# Patient Record
Sex: Female | Born: 1950 | Race: White | Hispanic: No | Marital: Single | Smoking: Former smoker
Health system: Southern US, Community
[De-identification: ages and names within clinical notes are randomized; demographics above are authoritative.]

## PROBLEM LIST (undated history)

## (undated) DIAGNOSIS — G4730 Sleep apnea, unspecified: Secondary | ICD-10-CM

## (undated) DIAGNOSIS — R112 Nausea with vomiting, unspecified: Secondary | ICD-10-CM

## (undated) DIAGNOSIS — I10 Essential (primary) hypertension: Secondary | ICD-10-CM

## (undated) DIAGNOSIS — F419 Anxiety disorder, unspecified: Secondary | ICD-10-CM

## (undated) DIAGNOSIS — C801 Malignant (primary) neoplasm, unspecified: Secondary | ICD-10-CM

## (undated) DIAGNOSIS — M1990 Unspecified osteoarthritis, unspecified site: Secondary | ICD-10-CM

## (undated) DIAGNOSIS — K219 Gastro-esophageal reflux disease without esophagitis: Secondary | ICD-10-CM

## (undated) DIAGNOSIS — Z9889 Other specified postprocedural states: Secondary | ICD-10-CM

## (undated) HISTORY — PX: BASAL CELL CARCINOMA EXCISION: SHX1214

## (undated) HISTORY — PX: TUBAL LIGATION: SHX77

---

## 2008-01-07 ENCOUNTER — Ambulatory Visit (HOSPITAL_COMMUNITY): Admission: RE | Admit: 2008-01-07 | Discharge: 2008-01-07 | Payer: Self-pay | Admitting: Otolaryngology

## 2010-08-01 ENCOUNTER — Encounter: Payer: Self-pay | Admitting: Otolaryngology

## 2013-07-10 ENCOUNTER — Encounter (HOSPITAL_BASED_OUTPATIENT_CLINIC_OR_DEPARTMENT_OTHER): Payer: Self-pay | Admitting: Emergency Medicine

## 2013-07-10 ENCOUNTER — Emergency Department (HOSPITAL_BASED_OUTPATIENT_CLINIC_OR_DEPARTMENT_OTHER)
Admission: EM | Admit: 2013-07-10 | Discharge: 2013-07-10 | Disposition: A | Discharge status: Home / Self Care | Payer: 59 | Attending: Emergency Medicine | Admitting: Emergency Medicine

## 2013-07-10 DIAGNOSIS — I10 Essential (primary) hypertension: Secondary | ICD-10-CM | POA: Insufficient documentation

## 2013-07-10 DIAGNOSIS — Z79899 Other long term (current) drug therapy: Secondary | ICD-10-CM | POA: Insufficient documentation

## 2013-07-10 HISTORY — DX: Essential (primary) hypertension: I10

## 2013-07-10 LAB — CBC WITH DIFFERENTIAL/PLATELET
BASOS ABS: 0 10*3/uL (ref 0.0–0.1)
BASOS PCT: 0 % (ref 0–1)
EOS ABS: 0.3 10*3/uL (ref 0.0–0.7)
Eosinophils Relative: 4 % (ref 0–5)
HCT: 41.1 % (ref 36.0–46.0)
Hemoglobin: 13.7 g/dL (ref 12.0–15.0)
Lymphocytes Relative: 27 % (ref 12–46)
Lymphs Abs: 2 10*3/uL (ref 0.7–4.0)
MCH: 29.6 pg (ref 26.0–34.0)
MCHC: 33.3 g/dL (ref 30.0–36.0)
MCV: 88.8 fL (ref 78.0–100.0)
MONOS PCT: 8 % (ref 3–12)
Monocytes Absolute: 0.6 10*3/uL (ref 0.1–1.0)
NEUTROS ABS: 4.4 10*3/uL (ref 1.7–7.7)
NEUTROS PCT: 61 % (ref 43–77)
Platelets: 232 10*3/uL (ref 150–400)
RBC: 4.63 MIL/uL (ref 3.87–5.11)
RDW: 12.7 % (ref 11.5–15.5)
WBC: 7.3 10*3/uL (ref 4.0–10.5)

## 2013-07-10 LAB — BASIC METABOLIC PANEL
BUN: 15 mg/dL (ref 6–23)
CHLORIDE: 102 meq/L (ref 96–112)
CO2: 28 mEq/L (ref 19–32)
CREATININE: 0.7 mg/dL (ref 0.50–1.10)
Calcium: 9.9 mg/dL (ref 8.4–10.5)
Glucose, Bld: 97 mg/dL (ref 70–99)
POTASSIUM: 4.3 meq/L (ref 3.7–5.3)
Sodium: 141 mEq/L (ref 137–147)

## 2013-07-10 NOTE — ED Notes (Addendum)
Pt c/o bp high and is on med for same. Pt sts she "freaked out" and it went higher. Pt sts her BP med was changed about a month ago. Pt also c/o of "feeling a little lightheaded" and has appt with PCP next Tuesday.

## 2013-07-10 NOTE — Discharge Instructions (Signed)
Keep a record of your blood pressures at home to take with you to your next doctor's appointment.  Return to the emergency department if you develop severe headache, chest pain, dizziness, or other new or concerning symptoms   Arterial Hypertension Arterial hypertension (high blood pressure) is a condition of elevated pressure in your blood vessels. Hypertension over a long period of time is a risk factor for strokes, heart attacks, and heart failure. It is also the leading cause of kidney (renal) failure.  CAUSES   In Adults -- Over 90% of all hypertension has no known cause. This is called essential or primary hypertension. In the other 10% of people with hypertension, the increase in blood pressure is caused by another disorder. This is called secondary hypertension. Important causes of secondary hypertension are:  Heavy alcohol use.  Obstructive sleep apnea.  Hyperaldosterosim (Conn's syndrome).  Steroid use.  Chronic kidney failure.  Hyperparathyroidism.  Medications.  Renal artery stenosis.  Pheochromocytoma.  Cushing's disease.  Coarctation of the aorta.  Scleroderma renal crisis.  Licorice (in excessive amounts).  Drugs (cocaine, methamphetamine). Your caregiver can explain any items above that apply to you.  In Children -- Secondary hypertension is more common and should always be considered.  Pregnancy -- Few women of childbearing age have high blood pressure. However, up to 10% of them develop hypertension of pregnancy. Generally, this will not harm the woman. It may be a sign of 3 complications of pregnancy: preeclampsia, HELLP syndrome, and eclampsia. Follow up and control with medication is necessary. SYMPTOMS   This condition normally does not produce any noticeable symptoms. It is usually found during a routine exam.  Malignant hypertension is a late problem of high blood pressure. It may have the following symptoms:  Headaches.  Blurred  vision.  End-organ damage (this means your kidneys, heart, lungs, and other organs are being damaged).  Stressful situations can increase the blood pressure. If a person with normal blood pressure has their blood pressure go up while being seen by their caregiver, this is often termed "white coat hypertension." Its importance is not known. It may be related with eventually developing hypertension or complications of hypertension.  Hypertension is often confused with mental tension, stress, and anxiety. DIAGNOSIS  The diagnosis is made by 3 separate blood pressure measurements. They are taken at least 1 week apart from each other. If there is organ damage from hypertension, the diagnosis may be made without repeat measurements. Hypertension is usually identified by having blood pressure readings:  Above 140/90 mmHg measured in both arms, at 3 separate times, over a couple weeks.  Over 130/80 mmHg should be considered a risk factor and may require treatment in patients with diabetes. Blood pressure readings over 120/80 mmHg are called "pre-hypertension" even in non-diabetic patients. To get a true blood pressure measurement, use the following guidelines. Be aware of the factors that can alter blood pressure readings.  Take measurements at least 1 hour after caffeine.  Take measurements 30 minutes after smoking and without any stress. This is another reason to quit smoking  it raises your blood pressure.  Use a proper cuff size. Ask your caregiver if you are not sure about your cuff size.  Most home blood pressure cuffs are automatic. They will measure systolic and diastolic pressures. The systolic pressure is the pressure reading at the start of sounds. Diastolic pressure is the pressure at which the sounds disappear. If you are elderly, measure pressures in multiple postures. Try sitting, lying  or standing.  Sit at rest for a minimum of 5 minutes before taking measurements.  You should not  be on any medications like decongestants. These are found in many cold medications.  Record your blood pressure readings and review them with your caregiver. If you have hypertension:  Your caregiver may do tests to be sure you do not have secondary hypertension (see "causes" above).  Your caregiver may also look for signs of metabolic syndrome. This is also called Syndrome X or Insulin Resistance Syndrome. You may have this syndrome if you have type 2 diabetes, abdominal obesity, and abnormal blood lipids in addition to hypertension.  Your caregiver will take your medical and family history and perform a physical exam.  Diagnostic tests may include blood tests (for glucose, cholesterol, potassium, and kidney function), a urinalysis, or an EKG. Other tests may also be necessary depending on your condition. PREVENTION  There are important lifestyle issues that you can adopt to reduce your chance of developing hypertension:  Maintain a normal weight.  Limit the amount of salt (sodium) in your diet.  Exercise often.  Limit alcohol intake.  Get enough potassium in your diet. Discuss specific advice with your caregiver.  Follow a DASH diet (dietary approaches to stop hypertension). This diet is rich in fruits, vegetables, and low-fat dairy products, and avoids certain fats. PROGNOSIS  Essential hypertension cannot be cured. Lifestyle changes and medical treatment can lower blood pressure and reduce complications. The prognosis of secondary hypertension depends on the underlying cause. Many people whose hypertension is controlled with medicine or lifestyle changes can live a normal, healthy life.  RISKS AND COMPLICATIONS  While high blood pressure alone is not an illness, it often requires treatment due to its short- and long-term effects on many organs. Hypertension increases your risk for:  CVAs or strokes (cerebrovascular accident).  Heart failure due to chronically high blood pressure  (hypertensive cardiomyopathy).  Heart attack (myocardial infarction).  Damage to the retina (hypertensive retinopathy).  Kidney failure (hypertensive nephropathy). Your caregiver can explain list items above that apply to you. Treatment of hypertension can significantly reduce the risk of complications. TREATMENT   For overweight patients, weight loss and regular exercise are recommended. Physical fitness lowers blood pressure.  Mild hypertension is usually treated with diet and exercise. A diet rich in fruits and vegetables, fat-free dairy products, and foods low in fat and salt (sodium) can help lower blood pressure. Decreasing salt intake decreases blood pressure in a 1/3 of people.  Stop smoking if you are a smoker. The steps above are highly effective in reducing blood pressure. While these actions are easy to suggest, they are difficult to achieve. Most patients with moderate or severe hypertension end up requiring medications to bring their blood pressure down to a normal level. There are several classes of medications for treatment. Blood pressure pills (antihypertensives) will lower blood pressure by their different actions. Lowering the blood pressure by 10 mmHg may decrease the risk of complications by as much as 25%. The goal of treatment is effective blood pressure control. This will reduce your risk for complications. Your caregiver will help you determine the best treatment for you according to your lifestyle. What is excellent treatment for one person, may not be for you. HOME CARE INSTRUCTIONS   Do not smoke.  Follow the lifestyle changes outlined in the "Prevention" section.  If you are on medications, follow the directions carefully. Blood pressure medications must be taken as prescribed. Skipping doses reduces their  benefit. It also puts you at risk for problems.  Follow up with your caregiver, as directed.  If you are asked to monitor your blood pressure at home,  follow the guidelines in the "Diagnosis" section above. SEEK MEDICAL CARE IF:   You think you are having medication side effects.  You have recurrent headaches or lightheadedness.  You have swelling in your ankles.  You have trouble with your vision. SEEK IMMEDIATE MEDICAL CARE IF:   You have sudden onset of chest pain or pressure, difficulty breathing, or other symptoms of a heart attack.  You have a severe headache.  You have symptoms of a stroke (such as sudden weakness, difficulty speaking, difficulty walking). MAKE SURE YOU:   Understand these instructions.  Will watch your condition.  Will get help right away if you are not doing well or get worse. Document Released: 06/26/2005 Document Revised: 09/18/2011 Document Reviewed: 01/24/2007 [facility] Patient Information 2014 [place].

## 2013-07-10 NOTE — ED Provider Notes (Signed)
CSN: [id number]     Arrival date & time 07/10/13  1218 History   First MD Initiated Contact with Patient 07/10/13 1327     Chief Complaint  Patient presents with  . Hypertension   (Consider location/radiation/quality/duration/timing/severity/associated sxs/prior Treatment) HPI Comments: Patient is a 63 year old female with history of hypertension on lisinopril. She presents today with complaints of elevated blood pressure. She was at home with her mother when she took her blood pressure with her mother's machine. He gave resolve approximately 180/100. She became anxious about this and check it again and it was higher. She then presented here to be evaluated. She denies she is experiencing any headache, chest pain, shortness of breath, dizziness or any other symptom.  Patient is a 63 y.o. female presenting with hypertension. The history is provided by the patient.  Hypertension This is a new problem. Episode onset: This morning. The problem occurs constantly. The problem has been gradually improving. Pertinent negatives include no chest pain, no abdominal pain and no shortness of breath. Nothing aggravates the symptoms. Nothing relieves the symptoms. She has tried nothing for the symptoms. The treatment provided no relief.    Past Medical History  Diagnosis Date  . Hypertension    History reviewed. No pertinent past surgical history. No family history on file. History  Substance Use Topics  . Smoking status: Never Smoker   . Smokeless tobacco: Not on file  . Alcohol Use: 0.6 oz/week    1 Cans of beer per week   OB History   Grav Para Term Preterm Abortions TAB SAB Ect Mult Living                 Review of Systems  Respiratory: Negative for shortness of breath.   Cardiovascular: Negative for chest pain.  Gastrointestinal: Negative for abdominal pain.  All other systems reviewed and are negative.    Allergies  Review of patient's allergies indicates no known allergies.  Home  Medications   Current Outpatient Rx  Name  Route  Sig  Dispense  Refill  . ibuprofen (ADVIL,MOTRIN) 800 MG tablet   Oral   Take 800 mg by mouth every 8 (eight) hours as needed.         Marland Kitchen lisinopril (PRINIVIL,ZESTRIL) 20 MG tablet   Oral   Take 20 mg by mouth daily.          BP 145/85  Pulse 94  Temp(Src) 98.4 F (36.9 C) (Oral)  Resp 18  Ht 6' (1.829 m)  Wt 204 lb (92.534 kg)  BMI 27.66 kg/m2  SpO2 100% Physical Exam  Nursing note and vitals reviewed. Constitutional: She is oriented to person, place, and time. She appears well-developed and well-nourished. No distress.  HENT:  Head: Normocephalic and atraumatic.  Eyes: EOM are normal. Pupils are equal, round, and reactive to light.  Neck: Normal range of motion. Neck supple.  Cardiovascular: Normal rate and regular rhythm.  Exam reveals no gallop and no friction rub.   No murmur heard. Pulmonary/Chest: Effort normal and breath sounds normal. No respiratory distress. She has no wheezes.  Abdominal: Soft. Bowel sounds are normal. She exhibits no distension. There is no tenderness.  Musculoskeletal: Normal range of motion.  Neurological: She is alert and oriented to person, place, and time. No cranial nerve deficit. She exhibits normal muscle tone. Coordination normal.  Skin: Skin is warm and dry. She is not diaphoretic.    ED Course  Procedures (including critical care time) Labs Review Labs Reviewed  CBC WITH DIFFERENTIAL  BASIC METABOLIC PANEL   Imaging Review No results found.  EKG Interpretation    Date/Time:  Thursday July 10 2013 12:52:45 EST Ventricular Rate:  86 PR Interval:  138 QRS Duration: 94 QT Interval:  360 QTC Calculation: 430 R Axis:   44 Text Interpretation:  Normal sinus rhythm Normal ECG Confirmed by DELOS  MD, Giovanni Bath ([id number]) on 07/10/2013 1:35:31 PM            MDM  No diagnosis found. Patient is a 63 year old female who presents here for evaluation of hypertension. Her blood  pressures were elevated on the home monitor and were elevated initially upon presentation. While I was evaluating the patient her blood pressure was 142/85. Laboratory studies are unremarkable and EKG is normal at this point I feel as though she is stable for discharge. She is to monitor her blood pressures at home keep a record of these to present to her primary doctor her next appointment to discuss whether she needs adjustments in her medications.    Veryl Speak, MD 07/10/13 1452

## 2013-09-11 NOTE — H&P (Signed)
TOTAL HIP ADMISSION H&P  Patient is admitted for left total hip arthroplasty, anterior approach.  Subjective:  Chief Complaint:      Left hip OA / pain  HPI: Mary Garner, 63 y.o. female, has a history of pain and functional disability in the left hip(s) due to arthritis and patient has failed non-surgical conservative treatments for greater than 12 weeks to include NSAID's and/or analgesics, corticosteriod injections and activity modification.  Onset of symptoms was gradual starting 1+ years ago with gradually worsening course since that time.The patient noted no past surgery on the left hip(s).  Patient currently rates pain in the left hip at 10 out of 10 with activity. Patient has night pain, worsening of pain with activity and weight bearing, trendelenberg gait, pain that interfers with activities of daily living and pain with passive range of motion. Patient has evidence of periarticular osteophytes and joint space narrowing by imaging studies. This condition presents safety issues increasing the risk of falls.  There is no current active infection.  Risks, benefits and expectations were discussed with the patient.  Risks including but not limited to the risk of anesthesia, blood clots, nerve damage, blood vessel damage, failure of the prosthesis, infection and up to and including death.  Patient understand the risks, benefits and expectations and wishes to proceed with surgery.   D/C Plans:   Home with HHPT  Post-op Meds:    No Rx given  Tranexamic Acid:   To be given  Decadron:    To be given  FYI:    ASA post-op  Norco post-op    Past Medical History  Diagnosis Date  . Hypertension   . Anxiety   . Sleep apnea     not diagnosed, sleep study in April, occasionally stops breathing  . GERD (gastroesophageal reflux disease)   . Arthritis   . Cancer     basal cell on face     Past Surgical History  Procedure Laterality Date  . Basal cell carcinoma excision  10 or 12 years ago    x3   . Tubal ligation  30 years ago    No Known Allergies   History  Substance Use Topics  . Smoking status: Never Smoker   . Smokeless tobacco: Never Used  . Alcohol Use: 0.0 oz/week     Comment: 2 beers or 2 wine glasses daily        Review of Systems  Constitutional: Negative.   HENT: Positive for hearing loss and tinnitus.   Eyes: Negative.   Respiratory: Negative.   Cardiovascular: Negative.   Gastrointestinal: Positive for heartburn.  Genitourinary: Negative.   Musculoskeletal: Positive for back pain and joint pain.  Skin: Negative.   Neurological: Negative.   Endo/Heme/Allergies: Negative.   Psychiatric/Behavioral: The patient is nervous/anxious.     Objective:  Physical Exam  Constitutional: She is oriented to person, place, and time. She appears well-developed and well-nourished.  HENT:  Head: Normocephalic and atraumatic.  Mouth/Throat: Oropharynx is clear and moist.  Eyes: Pupils are equal, round, and reactive to light.  Neck: Neck supple. No JVD present. No tracheal deviation present. No thyromegaly present.  Cardiovascular: Normal rate, regular rhythm, normal heart sounds and intact distal pulses.   Respiratory: Effort normal and breath sounds normal. No stridor. No respiratory distress. She has no wheezes.  GI: Soft. There is no tenderness. There is no guarding.  Musculoskeletal:       Left hip: She exhibits decreased range of motion, decreased  strength, tenderness and bony tenderness. She exhibits no swelling, no deformity and no laceration.  Lymphadenopathy:    She has no cervical adenopathy.  Neurological: She is alert and oriented to person, place, and time.  Skin: Skin is warm and dry.  Psychiatric: She has a normal mood and affect.     Labs:   Estimated body mass index is 27.66 kg/(m^2) as calculated from the following:   Height as of 07/10/13: 6' (1.829 m).   Weight as of 07/10/13: 92.534 kg (204 lb).   Imaging Review Plain radiographs  demonstrate severe degenerative joint disease of the left hip(s). The bone quality appears to be good for age and reported activity level.  Assessment/Plan:  End stage arthritis, left hip(s)  The patient history, physical examination, clinical judgement of the provider and imaging studies are consistent with end stage degenerative joint disease of the left hip(s) and total hip arthroplasty is deemed medically necessary. The treatment options including medical management, injection therapy, arthroscopy and arthroplasty were discussed at length. The risks and benefits of total hip arthroplasty were presented and reviewed. The risks due to aseptic loosening, infection, stiffness, dislocation/subluxation,  thromboembolic complications and other imponderables were discussed.  The patient acknowledged the explanation, agreed to proceed with the plan and consent was signed. Patient is being admitted for inpatient treatment for surgery, pain control, PT, OT, prophylactic antibiotics, VTE prophylaxis, progressive ambulation and ADL's and discharge planning.The patient is planning to be discharged home with home health services.      West Pugh [name]   PAC  09/11/2013, 10:21 AM

## 2013-09-12 ENCOUNTER — Encounter (HOSPITAL_COMMUNITY): Payer: Self-pay | Admitting: Pharmacy Technician

## 2013-09-16 ENCOUNTER — Encounter (HOSPITAL_COMMUNITY)
Admission: RE | Admit: 2013-09-16 | Discharge: 2013-09-16 | Disposition: A | Discharge status: Home / Self Care | Payer: 59 | Source: Ambulatory Visit | Attending: Orthopedic Surgery | Admitting: Orthopedic Surgery

## 2013-09-16 ENCOUNTER — Ambulatory Visit (HOSPITAL_COMMUNITY)
Admission: RE | Admit: 2013-09-16 | Discharge: 2013-09-16 | Disposition: A | Discharge status: Home / Self Care | Payer: 59 | Source: Ambulatory Visit | Attending: Orthopedic Surgery | Admitting: Orthopedic Surgery

## 2013-09-16 ENCOUNTER — Encounter (HOSPITAL_COMMUNITY): Payer: Self-pay

## 2013-09-16 DIAGNOSIS — Z01812 Encounter for preprocedural laboratory examination: Secondary | ICD-10-CM | POA: Insufficient documentation

## 2013-09-16 DIAGNOSIS — Z01818 Encounter for other preprocedural examination: Secondary | ICD-10-CM | POA: Insufficient documentation

## 2013-09-16 HISTORY — DX: Anxiety disorder, unspecified: F41.9

## 2013-09-16 HISTORY — DX: Unspecified osteoarthritis, unspecified site: M19.90

## 2013-09-16 HISTORY — DX: Malignant (primary) neoplasm, unspecified: C80.1

## 2013-09-16 HISTORY — DX: Sleep apnea, unspecified: G47.30

## 2013-09-16 HISTORY — DX: Gastro-esophageal reflux disease without esophagitis: K21.9

## 2013-09-16 LAB — URINALYSIS, ROUTINE W REFLEX MICROSCOPIC
Bilirubin Urine: NEGATIVE
GLUCOSE, UA: NEGATIVE mg/dL
Hgb urine dipstick: NEGATIVE
Ketones, ur: NEGATIVE mg/dL
NITRITE: NEGATIVE
PROTEIN: NEGATIVE mg/dL
Specific Gravity, Urine: 1.017 (ref 1.005–1.030)
Urobilinogen, UA: 0.2 mg/dL (ref 0.0–1.0)
pH: 5 (ref 5.0–8.0)

## 2013-09-16 LAB — CBC
HEMATOCRIT: 40.4 % (ref 36.0–46.0)
Hemoglobin: 13.7 g/dL (ref 12.0–15.0)
MCH: 30.1 pg (ref 26.0–34.0)
MCHC: 33.9 g/dL (ref 30.0–36.0)
MCV: 88.8 fL (ref 78.0–100.0)
Platelets: 267 10*3/uL (ref 150–400)
RBC: 4.55 MIL/uL (ref 3.87–5.11)
RDW: 13.4 % (ref 11.5–15.5)
WBC: 7.1 10*3/uL (ref 4.0–10.5)

## 2013-09-16 LAB — BASIC METABOLIC PANEL
BUN: 15 mg/dL (ref 6–23)
CO2: 26 meq/L (ref 19–32)
Calcium: 9.6 mg/dL (ref 8.4–10.5)
Chloride: 103 mEq/L (ref 96–112)
Creatinine, Ser: 0.65 mg/dL (ref 0.50–1.10)
GFR calc Af Amer: >90 mL/min (ref >90)
GFR calc non Af Amer: >90 mL/min (ref >90)
Glucose, Bld: 94 mg/dL (ref 70–99)
POTASSIUM: 4.1 meq/L (ref 3.7–5.3)
Sodium: 140 mEq/L (ref 137–147)

## 2013-09-16 LAB — URINE MICROSCOPIC-ADD ON

## 2013-09-16 LAB — SURGICAL PCR SCREEN
MRSA, PCR: NEGATIVE
Staphylococcus aureus: NEGATIVE

## 2013-09-16 LAB — PROTIME-INR
INR: 0.87 (ref 0.00–1.49)
Prothrombin Time: 11.7 seconds (ref 11.6–15.2)

## 2013-09-16 LAB — ABO/RH: ABO/RH(D): A POS

## 2013-09-16 LAB — APTT: APTT: 26 s (ref 24–37)

## 2013-09-16 NOTE — Progress Notes (Signed)
Surgery clearance note Dr. Noberto Retort on chart, [place] note Dr. Noberto Retort 08/18/13 on chart, EKG 07/11/13 on EPIC

## 2013-09-16 NOTE — Progress Notes (Signed)
09/16/13 1450  OBSTRUCTIVE SLEEP APNEA  Have you ever been diagnosed with sleep apnea through a sleep study? No  Do you snore loudly (loud enough to be heard through closed doors)?  1  Do you often feel tired, fatigued, or sleepy during the daytime? 1  Has anyone observed you stop breathing during your sleep? 1  Do you have, or are you being treated for high blood pressure? 1  BMI more than 35 kg/m2? 0  Age over 62 years old? 1  Neck circumference greater than 40 cm/18 inches? 0  Gender: 0  Obstructive Sleep Apnea Score 5  Score 4 or greater  Results sent to PCP

## 2013-09-16 NOTE — Patient Instructions (Addendum)
[place]  09/16/2013   Your procedure is scheduled on: 09/23/13  Report to [facility] at 12:00 PM.  Call this number if you have problems the morning of surgery [phone]: [phone]   Remember:   Do not eat food After Midnight, clear liquids from midnight until 9:00 am on 09/23/13 then nothing.      Take these medicines the morning of surgery with A SIP OF WATER: amlodipine   Do not wear jewelry, make-up or nail polish.  Do not wear lotions, powders, or perfumes. You may wear deodorant.  Do not shave 48 hours prior to surgery. Men may shave face and neck.  Do not bring valuables to the hospital.  Contacts, dentures or bridgework may not be worn into surgery.  Leave suitcase in the car. After surgery it may be brought to your room.  For patients admitted to the hospital, checkout time is 11:00 AM the day of discharge.   Please read over the following fact sheets that you were given:[facility] preparing for surgery sheet, MRSA Information, incentive spirometry fact sheet, blood fact sheet Paulette Blanch, RN  pre op nurse call if needed [phone]    FAILURE TO [facility]   Patient Signature: ___________________________________________

## 2013-09-22 NOTE — Progress Notes (Addendum)
Surgery time changed from 1500 to 0900 on 09/23/13. Pt called and agreed to arrive at short stay at 6:30 am on 09/23/13. No questions or concerns.

## 2013-09-23 ENCOUNTER — Encounter (HOSPITAL_COMMUNITY): Payer: 59 | Admitting: Anesthesiology

## 2013-09-23 ENCOUNTER — Inpatient Hospital Stay (HOSPITAL_COMMUNITY): Payer: 59

## 2013-09-23 ENCOUNTER — Encounter (HOSPITAL_COMMUNITY): Payer: Self-pay

## 2013-09-23 ENCOUNTER — Encounter (HOSPITAL_COMMUNITY)
Admission: RE | Disposition: A | Discharge status: Home Health | Payer: Self-pay | Source: Ambulatory Visit | Attending: Orthopedic Surgery

## 2013-09-23 ENCOUNTER — Inpatient Hospital Stay (HOSPITAL_COMMUNITY): Payer: 59 | Admitting: Anesthesiology

## 2013-09-23 ENCOUNTER — Inpatient Hospital Stay (HOSPITAL_COMMUNITY)
Admission: RE | Admit: 2013-09-23 | Discharge: 2013-09-24 | DRG: 470 | Disposition: A | Discharge status: Home Health | Payer: 59 | Source: Ambulatory Visit | Attending: Orthopedic Surgery | Admitting: Orthopedic Surgery

## 2013-09-23 DIAGNOSIS — D500 Iron deficiency anemia secondary to blood loss (chronic): Secondary | ICD-10-CM | POA: Diagnosis not present

## 2013-09-23 DIAGNOSIS — E669 Obesity, unspecified: Secondary | ICD-10-CM | POA: Diagnosis present

## 2013-09-23 DIAGNOSIS — M169 Osteoarthritis of hip, unspecified: Principal | ICD-10-CM | POA: Diagnosis present

## 2013-09-23 DIAGNOSIS — M1610 Unilateral primary osteoarthritis, unspecified hip: Principal | ICD-10-CM | POA: Diagnosis present

## 2013-09-23 DIAGNOSIS — Z96649 Presence of unspecified artificial hip joint: Secondary | ICD-10-CM

## 2013-09-23 DIAGNOSIS — Z85828 Personal history of other malignant neoplasm of skin: Secondary | ICD-10-CM

## 2013-09-23 DIAGNOSIS — I10 Essential (primary) hypertension: Secondary | ICD-10-CM | POA: Diagnosis present

## 2013-09-23 DIAGNOSIS — F411 Generalized anxiety disorder: Secondary | ICD-10-CM | POA: Diagnosis present

## 2013-09-23 DIAGNOSIS — Z6831 Body mass index (BMI) 31.0-31.9, adult: Secondary | ICD-10-CM

## 2013-09-23 DIAGNOSIS — K219 Gastro-esophageal reflux disease without esophagitis: Secondary | ICD-10-CM | POA: Diagnosis present

## 2013-09-23 DIAGNOSIS — D62 Acute posthemorrhagic anemia: Secondary | ICD-10-CM | POA: Diagnosis not present

## 2013-09-23 HISTORY — PX: TOTAL HIP ARTHROPLASTY: SHX124

## 2013-09-23 LAB — TYPE AND SCREEN
ABO/RH(D): A POS
Antibody Screen: NEGATIVE

## 2013-09-23 SURGERY — ARTHROPLASTY, HIP, TOTAL, ANTERIOR APPROACH
Anesthesia: Spinal | Site: Hip | Laterality: Left

## 2013-09-23 MED ORDER — 0.9 % SODIUM CHLORIDE (POUR BTL) OPTIME
TOPICAL | Status: DC | PRN
  Administered 2013-09-23: 1000 mL

## 2013-09-23 MED ORDER — SENNA 8.6 MG PO TABS
1.0000 | ORAL_TABLET | Freq: Two times a day (BID) | ORAL | Status: DC
  Administered 2013-09-23 (×2): 8.6 mg via ORAL

## 2013-09-23 MED ORDER — LACTATED RINGERS IV SOLN: INTRAVENOUS | Status: DC | PRN

## 2013-09-23 MED ORDER — DEXAMETHASONE SODIUM PHOSPHATE 10 MG/ML IJ SOLN
10.0000 mg | Freq: Once | INTRAMUSCULAR | Status: AC
  Administered 2013-09-23: 10 mg via INTRAVENOUS

## 2013-09-23 MED ORDER — HYDROMORPHONE HCL PF 1 MG/ML IJ SOLN: 0.5000 mg | INTRAMUSCULAR | Status: DC | PRN

## 2013-09-23 MED ORDER — LACTATED RINGERS IV SOLN: INTRAVENOUS | Status: DC

## 2013-09-23 MED ORDER — BUPIVACAINE HCL (PF) 0.5 % IJ SOLN
INTRAMUSCULAR | Status: AC
  Filled 2013-09-23: qty 30

## 2013-09-23 MED ORDER — LIDOCAINE HCL (CARDIAC) 20 MG/ML IV SOLN
INTRAVENOUS | Status: AC
  Filled 2013-09-23: qty 5

## 2013-09-23 MED ORDER — ALUM & MAG HYDROXIDE-SIMETH 200-200-20 MG/5ML PO SUSP: 30.0000 mL | ORAL | Status: DC | PRN

## 2013-09-23 MED ORDER — MIDAZOLAM HCL 2 MG/2ML IJ SOLN
INTRAMUSCULAR | Status: AC
  Filled 2013-09-23: qty 2

## 2013-09-23 MED ORDER — HYDROCODONE-ACETAMINOPHEN 7.5-325 MG PO TABS
1.0000 | ORAL_TABLET | ORAL | Status: DC
  Administered 2013-09-23: 1 via ORAL
  Administered 2013-09-23: 2 via ORAL
  Administered 2013-09-23: 1 via ORAL
  Administered 2013-09-24 (×2): 2 via ORAL
  Filled 2013-09-23 (×3): qty 2
  Filled 2013-09-23: qty 1
  Filled 2013-09-23: qty 2
  Filled 2013-09-23: qty 1

## 2013-09-23 MED ORDER — PROPOFOL 10 MG/ML IV BOLUS
INTRAVENOUS | Status: AC
  Filled 2013-09-23: qty 20

## 2013-09-23 MED ORDER — FAMOTIDINE 20 MG PO TABS
20.0000 mg | ORAL_TABLET | Freq: Every day | ORAL | Status: DC | PRN
  Filled 2013-09-23: qty 1

## 2013-09-23 MED ORDER — AMLODIPINE BESYLATE 5 MG PO TABS
5.0000 mg | ORAL_TABLET | Freq: Every morning | ORAL | Status: DC
  Administered 2013-09-24: 5 mg via ORAL
  Filled 2013-09-23: qty 1

## 2013-09-23 MED ORDER — METOCLOPRAMIDE HCL 10 MG PO TABS: 5.0000 mg | ORAL_TABLET | Freq: Four times a day (QID) | ORAL | Status: DC | PRN

## 2013-09-23 MED ORDER — FERROUS SULFATE 325 (65 FE) MG PO TABS
325.0000 mg | ORAL_TABLET | Freq: Three times a day (TID) | ORAL | Status: DC
Start: 2013-09-23 — End: 2013-09-24
  Administered 2013-09-24: 325 mg via ORAL
  Filled 2013-09-23 (×5): qty 1

## 2013-09-23 MED ORDER — FENTANYL CITRATE 0.05 MG/ML IJ SOLN
INTRAMUSCULAR | Status: AC
  Filled 2013-09-23: qty 2

## 2013-09-23 MED ORDER — SODIUM CHLORIDE 0.9 % IV SOLN
INTRAVENOUS | Status: DC
  Administered 2013-09-23 (×2): via INTRAVENOUS
  Filled 2013-09-23 (×5): qty 1000

## 2013-09-23 MED ORDER — FENTANYL CITRATE 0.05 MG/ML IJ SOLN
INTRAMUSCULAR | Status: DC | PRN
  Administered 2013-09-23: 100 ug via INTRAVENOUS

## 2013-09-23 MED ORDER — METHOCARBAMOL 500 MG PO TABS
500.0000 mg | ORAL_TABLET | Freq: Four times a day (QID) | ORAL | Status: DC | PRN
  Administered 2013-09-24: 500 mg via ORAL
  Filled 2013-09-23: qty 1

## 2013-09-23 MED ORDER — CEFAZOLIN SODIUM-DEXTROSE 2-3 GM-% IV SOLR
INTRAVENOUS | Status: AC
  Filled 2013-09-23: qty 50

## 2013-09-23 MED ORDER — MIDAZOLAM HCL 5 MG/5ML IJ SOLN
INTRAMUSCULAR | Status: DC | PRN
  Administered 2013-09-23: 2 mg via INTRAVENOUS

## 2013-09-23 MED ORDER — CEFAZOLIN SODIUM-DEXTROSE 2-3 GM-% IV SOLR
2.0000 g | INTRAVENOUS | Status: AC
  Administered 2013-09-23: 2 g via INTRAVENOUS

## 2013-09-23 MED ORDER — DEXAMETHASONE SODIUM PHOSPHATE 10 MG/ML IJ SOLN
10.0000 mg | Freq: Once | INTRAMUSCULAR | Status: DC
  Filled 2013-09-23: qty 1

## 2013-09-23 MED ORDER — MENTHOL 3 MG MT LOZG: 1.0000 | LOZENGE | OROMUCOSAL | Status: DC | PRN

## 2013-09-23 MED ORDER — ASPIRIN EC 325 MG PO TBEC
325.0000 mg | DELAYED_RELEASE_TABLET | Freq: Two times a day (BID) | ORAL | Status: DC
  Administered 2013-09-24: 325 mg via ORAL
  Filled 2013-09-23 (×3): qty 1

## 2013-09-23 MED ORDER — ALPRAZOLAM 0.5 MG PO TABS
0.5000 mg | ORAL_TABLET | Freq: Two times a day (BID) | ORAL | Status: DC | PRN
  Administered 2013-09-23: 0.5 mg via ORAL
  Filled 2013-09-23: qty 1

## 2013-09-23 MED ORDER — SODIUM CHLORIDE 0.9 % IV SOLN
1000.0000 mg | Freq: Once | INTRAVENOUS | Status: AC
  Administered 2013-09-23: 1000 mg via INTRAVENOUS
  Filled 2013-09-23: qty 10

## 2013-09-23 MED ORDER — HYDROMORPHONE HCL PF 1 MG/ML IJ SOLN: 0.2500 mg | INTRAMUSCULAR | Status: DC | PRN

## 2013-09-23 MED ORDER — DEXAMETHASONE SODIUM PHOSPHATE 10 MG/ML IJ SOLN
10.0000 mg | Freq: Once | INTRAMUSCULAR | Status: AC
  Administered 2013-09-24: 10 mg via INTRAVENOUS
  Filled 2013-09-23: qty 1

## 2013-09-23 MED ORDER — LACTATED RINGERS IV SOLN
INTRAVENOUS | Status: DC | PRN
  Administered 2013-09-23 (×2): via INTRAVENOUS

## 2013-09-23 MED ORDER — KETAMINE HCL 10 MG/ML IJ SOLN
INTRAMUSCULAR | Status: AC
  Filled 2013-09-23: qty 1

## 2013-09-23 MED ORDER — DIPHENHYDRAMINE HCL 12.5 MG/5ML PO ELIX: 25.0000 mg | ORAL_SOLUTION | Freq: Four times a day (QID) | ORAL | Status: DC | PRN

## 2013-09-23 MED ORDER — POLYETHYLENE GLYCOL 3350 17 G PO PACK: 17.0000 g | PACK | Freq: Every day | ORAL | Status: DC | PRN

## 2013-09-23 MED ORDER — PHENOL 1.4 % MT LIQD: 1.0000 | OROMUCOSAL | Status: DC | PRN

## 2013-09-23 MED ORDER — PROPOFOL INFUSION 10 MG/ML OPTIME
INTRAVENOUS | Status: DC | PRN
  Administered 2013-09-23: 70 ug/kg/min via INTRAVENOUS

## 2013-09-23 MED ORDER — ONDANSETRON HCL 4 MG/2ML IJ SOLN
4.0000 mg | Freq: Four times a day (QID) | INTRAMUSCULAR | Status: DC | PRN
  Administered 2013-09-24: 4 mg via INTRAVENOUS
  Filled 2013-09-23: qty 2

## 2013-09-23 MED ORDER — ROCURONIUM BROMIDE 100 MG/10ML IV SOLN
INTRAVENOUS | Status: AC
  Filled 2013-09-23: qty 1

## 2013-09-23 MED ORDER — ASPIRIN EC 325 MG PO TBEC
325.0000 mg | DELAYED_RELEASE_TABLET | Freq: Two times a day (BID) | ORAL | Status: DC
  Filled 2013-09-23 (×2): qty 1

## 2013-09-23 MED ORDER — BUPIVACAINE HCL (PF) 0.5 % IJ SOLN
INTRAMUSCULAR | Status: DC | PRN
  Administered 2013-09-23: 15 mg

## 2013-09-23 MED ORDER — METHOCARBAMOL 100 MG/ML IJ SOLN
500.0000 mg | Freq: Four times a day (QID) | INTRAVENOUS | Status: DC | PRN
  Administered 2013-09-23: 500 mg via INTRAVENOUS
  Filled 2013-09-23: qty 5

## 2013-09-23 MED ORDER — METOCLOPRAMIDE HCL 5 MG/ML IJ SOLN: 5.0000 mg | Freq: Four times a day (QID) | INTRAMUSCULAR | Status: DC | PRN

## 2013-09-23 MED ORDER — PHENYLEPHRINE 40 MCG/ML (10ML) SYRINGE FOR IV PUSH (FOR BLOOD PRESSURE SUPPORT)
PREFILLED_SYRINGE | INTRAVENOUS | Status: AC
  Filled 2013-09-23: qty 10

## 2013-09-23 MED ORDER — CEFAZOLIN SODIUM-DEXTROSE 2-3 GM-% IV SOLR
2.0000 g | Freq: Four times a day (QID) | INTRAVENOUS | Status: AC
  Administered 2013-09-23 (×2): 2 g via INTRAVENOUS
  Filled 2013-09-23 (×2): qty 50

## 2013-09-23 MED ORDER — PHENYLEPHRINE HCL 10 MG/ML IJ SOLN
INTRAMUSCULAR | Status: DC | PRN
  Administered 2013-09-23: 80 ug via INTRAVENOUS

## 2013-09-23 MED ORDER — ONDANSETRON HCL 4 MG/2ML IJ SOLN
INTRAMUSCULAR | Status: DC | PRN
  Administered 2013-09-23: 4 mg via INTRAVENOUS

## 2013-09-23 MED ORDER — KETAMINE HCL 10 MG/ML IJ SOLN
INTRAMUSCULAR | Status: DC | PRN
  Administered 2013-09-23 (×2): 20 mg via INTRAVENOUS

## 2013-09-23 MED ORDER — DOCUSATE SODIUM 100 MG PO CAPS
100.0000 mg | ORAL_CAPSULE | Freq: Two times a day (BID) | ORAL | Status: DC
  Administered 2013-09-23 – 2013-09-24 (×3): 100 mg via ORAL

## 2013-09-23 MED ORDER — ONDANSETRON HCL 4 MG PO TABS: 4.0000 mg | ORAL_TABLET | Freq: Four times a day (QID) | ORAL | Status: DC | PRN

## 2013-09-23 SURGICAL SUPPLY — 32 items
BAG ZIPLOCK 12X15 (MISCELLANEOUS) IMPLANT
CAPT HIP PF COP ×2 IMPLANT
DERMABOND ADVANCED (GAUZE/BANDAGES/DRESSINGS) ×1
DERMABOND ADVANCED .7 DNX12 (GAUZE/BANDAGES/DRESSINGS) ×1 IMPLANT
DRAPE C-ARM 42X120 X-RAY (DRAPES) ×2 IMPLANT
DRAPE STERI IOBAN 125X83 (DRAPES) ×2 IMPLANT
DRAPE U-SHAPE 47X51 STRL (DRAPES) ×6 IMPLANT
DRSG AQUACEL AG ADV 3.5X10 (GAUZE/BANDAGES/DRESSINGS) ×2 IMPLANT
DURAPREP 26ML APPLICATOR (WOUND CARE) ×2 IMPLANT
ELECT BLADE TIP CTD 4 INCH (ELECTRODE) ×2 IMPLANT
ELECT REM PT RETURN 9FT ADLT (ELECTROSURGICAL) ×2
ELECTRODE REM PT RTRN 9FT ADLT (ELECTROSURGICAL) ×1 IMPLANT
FACESHIELD LNG OPTICON STERILE (SAFETY) ×2 IMPLANT
GLOVE BIOGEL PI IND STRL 7.5 (GLOVE) ×1 IMPLANT
GLOVE BIOGEL PI IND STRL 8 (GLOVE) ×1 IMPLANT
GLOVE BIOGEL PI INDICATOR 7.5 (GLOVE) ×1
GLOVE BIOGEL PI INDICATOR 8 (GLOVE) ×1
GLOVE ECLIPSE 8.0 STRL XLNG CF (GLOVE) ×2 IMPLANT
GLOVE ORTHO TXT STRL SZ7.5 (GLOVE) ×4 IMPLANT
GOWN SPEC L3 XXLG W/TWL (GOWN DISPOSABLE) ×4 IMPLANT
GOWN STRL REUS W/TWL LRG LVL3 (GOWN DISPOSABLE) ×4 IMPLANT
KIT BASIN OR (CUSTOM PROCEDURE TRAY) ×2 IMPLANT
PACK TOTAL JOINT (CUSTOM PROCEDURE TRAY) ×2 IMPLANT
PADDING CAST COTTON 6X4 STRL (CAST SUPPLIES) ×2 IMPLANT
SAW OSC TIP CART 19.5X105X1.3 (SAW) ×2 IMPLANT
SUT MNCRL AB 4-0 PS2 18 (SUTURE) ×2 IMPLANT
SUT VIC AB 1 CT1 36 (SUTURE) ×6 IMPLANT
SUT VIC AB 2-0 CT1 27 (SUTURE) ×2
SUT VIC AB 2-0 CT1 TAPERPNT 27 (SUTURE) ×2 IMPLANT
SUT VLOC 180 0 24IN GS25 (SUTURE) ×2 IMPLANT
TOWEL OR 17X26 10 PK STRL BLUE (TOWEL DISPOSABLE) ×2 IMPLANT
TRAY FOLEY CATH 14FRSI W/METER (CATHETERS) ×2 IMPLANT

## 2013-09-23 NOTE — Evaluation (Signed)
Physical Therapy Evaluation Patient Details Name: Mary Garner MRN: [id number] DOB: 04/23/1951 Today's Date: 09/23/2013 Time: 7253-6644 PT Time Calculation (min): 25 min  PT Assessment / Plan / Recommendation History of Present Illness  L THA -direct anterior  Clinical Impression  *Pt is s/p THA resulting in the deficits listed below (see PT Problem List). ** Pt will benefit from skilled PT to increase their independence and safety with mobility to allow discharge to the venue listed below.    **    PT Assessment  Patient needs continued PT services    Follow Up Recommendations  Home health PT    Does the patient have the potential to tolerate intense rehabilitation      Barriers to Discharge        Equipment Recommendations  None recommended by PT    Recommendations for Other Services     Frequency 7X/week    Precautions / Restrictions Precautions Precautions: None Restrictions Weight Bearing Restrictions: No Other Position/Activity Restrictions: WBAT   Pertinent Vitals/Pain *pain 1/10 L hip with walking Premedicated, ice applied**      Mobility  Bed Mobility Overal bed mobility: Needs Assistance Bed Mobility: Supine to Sit Supine to sit: Min assist General bed mobility comments: assist to support LLE Transfers Overall transfer level: Needs assistance Equipment used: Rolling walker (2 wheeled) Transfers: Sit to/from Stand Sit to Stand: Min assist;From elevated surface General transfer comment: VCs hand placement, assist to rise Ambulation/Gait Ambulation/Gait assistance: Min guard Ambulation Distance (Feet): 80 Feet Assistive device: Rolling walker (2 wheeled) Gait Pattern/deviations: Step-to pattern Gait velocity interpretation: Below normal speed for age/gender General Gait Details: VCs sequencing    Exercises Total Joint Exercises Ankle Circles/Pumps: AROM;Both;10 reps;Supine Quad Sets: AROM Gluteal Sets: Left;5 reps;Seated Heel Slides:  AAROM;Left;10 reps;Supine Hip ABduction/ADduction: AAROM;Left;10 reps;Supine   PT Diagnosis: Difficulty walking;Acute pain  PT Problem List: Decreased strength;Decreased activity tolerance;Decreased knowledge of use of DME;Decreased mobility PT Treatment Interventions: Gait training;DME instruction;Functional mobility training;Therapeutic exercise;Therapeutic activities;Patient/family education     PT Goals(Current goals can be found in the care plan section) Acute Rehab PT Goals Patient Stated Goal: to garden PT Goal Formulation: With patient Time For Goal Achievement: 09/30/13 Potential to Achieve Goals: Good  Visit Information  Last PT Received On: 09/23/13 Assistance Needed: +1 History of Present Illness: L THA -direct anterior       Prior Functioning  Home Living Family/patient expects to be discharged to:: Private residence Living Arrangements: Children (daughter coming to stay with pt, normally lives alone) Available Help at Discharge: Family;Available 24 hours/day Type of Home: House Home Access: Level entry Home Layout: One level Home Equipment: Walker - 2 wheels;Cane - single point;Wheelchair - Ecologist) Prior Function Level of Independence: Independent with assistive device(s) Comments: used WC at work    Solicitor Arousal/Alertness: Awake/alert Behavior During Therapy: WFL for tasks assessed/performed Overall Cognitive Status: Within Functional Limits for tasks assessed    Extremity/Trunk Assessment Upper Extremity Assessment Upper Extremity Assessment: Overall WFL for tasks assessed Lower Extremity Assessment Lower Extremity Assessment: LLE deficits/detail LLE Deficits / Details: ankle 5/5, knee ext -3/5, hip +2/5 Cervical / Trunk Assessment Cervical / Trunk Assessment: Normal   Balance    End of Session PT - End of Session Equipment Utilized During Treatment: Gait belt Activity Tolerance: Patient tolerated treatment  well Patient left: in chair;with call bell/phone within reach Nurse Communication: Mobility status  GP     Blondell Reveal Kistler 09/23/2013, 4:18 PM [phone]

## 2013-09-23 NOTE — Op Note (Signed)
NAME:  Mary Garner                ACCOUNT NO.: [id number]      MEDICAL RECORD NO.: [id number]      FACILITY:  [facility]      PHYSICIAN:  Paralee Cancel D  DATE OF BIRTH:  1951-04-04     DATE OF PROCEDURE:  09/23/2013                                 OPERATIVE REPORT         PREOPERATIVE DIAGNOSIS: Left  hip osteoarthritis.      POSTOPERATIVE DIAGNOSIS:  Left hip osteoarthritis.      PROCEDURE:  Left total hip replacement through an anterior approach   utilizing DePuy THR system, component size 4mm pinnacle cup, a size 36+4 neutral   Altrex liner, a size 7 Hi Tri Lock stem with a 36+1.5 delta ceramic   ball.      SURGEON:  Pietro Cassis. Alvan Dame, M.D.      ASSISTANT:  Molli Barrows, PA-C      ANESTHESIA:  Spinal.      SPECIMENS:  None.      COMPLICATIONS:  None.      BLOOD LOSS:  300 cc     DRAINS:  None     INDICATION OF THE PROCEDURE:  Mary Garner is a 63 y.o. female who had   presented to office for evaluation of left hip pain.  Radiographs revealed   progressive degenerative changes with bone-on-bone   articulation to the  hip joint.  The patient had painful limited range of   motion significantly affecting their overall quality of life.  The patient was failing to    respond to conservative measures, and at this point was ready   to proceed with more definitive measures.  The patient has noted progressive   degenerative changes in his hip, progressive problems and dysfunction   with regarding the hip prior to surgery.  Consent was obtained for   benefit of pain relief.  Specific risk of infection, DVT, component   failure, dislocation, need for revision surgery, as well discussion of   the anterior versus posterior approach were reviewed.  Consent was   obtained for benefit of anterior pain relief through an anterior   approach.      PROCEDURE IN DETAIL:  The patient was brought to operative theater.   Once adequate anesthesia, preoperative  antibiotics, 2gm Ancef administered.   The patient was positioned supine on the OSI Hanna table.  Once adequate   padding of boney process was carried out, we had predraped out the hip, and  used fluoroscopy to confirm orientation of the pelvis and position.      The left hip was then prepped and draped from proximal iliac crest to   mid thigh with shower curtain technique.      Time-out was performed identifying the patient, planned procedure, and   extremity.     An incision was then made 2 cm distal and lateral to the   anterior superior iliac spine extending over the orientation of the   tensor fascia lata muscle and sharp dissection was carried down to the   fascia of the muscle and protractor placed in the soft tissues.      The fascia was then incised.  The muscle belly was identified and swept  laterally and retractor placed along the superior neck.  Following   cauterization of the circumflex vessels and removing some pericapsular   fat, a second cobra retractor was placed on the inferior neck.  A third   retractor was placed on the anterior acetabulum after elevating the   anterior rectus.  A L-capsulotomy was along the line of the   superior neck to the trochanteric fossa, then extended proximally and   distally.  Tag sutures were placed and the retractors were then placed   intracapsular.  We then identified the trochanteric fossa and   orientation of my neck cut, confirmed this radiographically   and then made a neck osteotomy with the femur on traction.  The femoral   head was removed without difficulty or complication.  Traction was let   off and retractors were placed posterior and anterior around the   acetabulum.      The labrum and foveal tissue were debrided.  I began reaming with a 91mm   reamer and reamed up to 59mm reamer with good bony bed preparation and a 54   cup was chosen.  The final 82mm Pinnacle cup was then impacted under fluoroscopy  to confirm the  depth of penetration and orientation with respect to   abduction.  A screw was placed followed by the hole eliminator.  The final   36+4 neutral Altrex liner was impacted with good visualized rim fit.  The cup was positioned anatomically within the acetabular portion of the pelvis.      At this point, the femur was rolled at 80 degrees.  Further capsule was   released off the inferior aspect of the femoral neck.  I then   released the superior capsule proximally.  The hook was placed laterally   along the femur and elevated manually and held in position with the bed   hook.  The leg was then extended and adducted with the leg rolled to 100   degrees of external rotation.  Once the proximal femur was fully   exposed, I used a box osteotome to set orientation.  I then began   broaching with the starting chili pepper broach and passed this by hand and then broached up to 7.  With the 7 broach in place I chose a high offset  neck and did a trial reduction.  The offset was appropriate, leg lengths   appeared to be equal, confirmed radiographically.   Given these findings, I went ahead and dislocated the hip, repositioned all   retractors and positioned the right hip in the extended and abducted position.  The final 7 Hi Tri Lock stem was   chosen and it was impacted down to the level of neck cut.  Based on this   and the trial reduction, a 36+1.5 delta ceramic ball was chosen and   impacted onto a clean and dry trunnion, and the hip was reduced.  The   hip had been irrigated throughout the case again at this point.  I did   reapproximate the superior capsular leaflet to the anterior leaflet   using #1 Vicryl.  The fascia of the   tensor fascia lata muscle was then reapproximated using #1 Vicryl and #0 V-lock sutures. The   remaining wound was closed with 2-0 Vicryl and running 4-0 Monocryl.   The hip was cleaned, dried, and dressed sterilely using Dermabond and   Aquacel dressing.  She was then  brought   to recovery room  in stable condition tolerating the procedure well.    Molli Barrows, PA-C was present for the entirety of the case involved from   preoperative positioning, perioperative retractor management, general   facilitation of the case, as well as primary wound closure as assistant.            Pietro Cassis Alvan Dame, M.D.        09/23/2013 10:18 AM

## 2013-09-23 NOTE — Anesthesia Procedure Notes (Signed)
Spinal  Patient location during procedure: OR Start time: 09/23/2013 8:50 AM End time: 09/23/2013 8:55 AM Staffing CRNA/Resident: Harle Stanford R Performed by: resident/CRNA  Preanesthetic Checklist Completed: patient identified, site marked, surgical consent, pre-op evaluation, timeout performed, IV checked, risks and benefits discussed and monitors and equipment checked Spinal Block Patient position: sitting Prep: Betadine Patient monitoring: heart rate, cardiac monitor, continuous pulse ox and blood pressure Approach: midline Location: L3-4 Injection technique: single-shot Needle Needle type: Whitacre  Needle gauge: 24 G Needle length: 9 cm Needle insertion depth: 7 cm Assessment Sensory level: T6

## 2013-09-23 NOTE — Progress Notes (Signed)
UR completed. Kyland No RN CCM Case Mgmt phone [phone] 

## 2013-09-23 NOTE — Progress Notes (Signed)
CSW consulted for SNF placement. PN reviewed. PT recommends HHPT following hospital d/c. RNCM will assist with d/c planning.  Makenzye Troutman LCSW [phone] 

## 2013-09-23 NOTE — Preoperative (Signed)
Beta Blockers   Reason not to administer Beta Blockers:Not Applicable 

## 2013-09-23 NOTE — Anesthesia Preprocedure Evaluation (Addendum)
Anesthesia Evaluation  Patient identified by MRN, date of birth, ID band Patient awake    Reviewed: Allergy & Precautions, H&P , NPO status , Patient's Chart, lab work & pertinent test results  Airway Mallampati: III TM Distance: >3 FB Neck ROM: full    Dental no notable dental hx. (+) Teeth Intact, Dental Advisory Given   Pulmonary sleep apnea ,  breath sounds clear to auscultation  Pulmonary exam normal       Cardiovascular hypertension, Pt. on medications Rhythm:regular Rate:Normal     Neuro/Psych negative neurological ROS  negative psych ROS   GI/Hepatic negative GI ROS, Neg liver ROS, GERD-  Medicated and Controlled,  Endo/Other  negative endocrine ROS  Renal/GU negative Renal ROS  negative genitourinary   Musculoskeletal   Abdominal   Peds  Hematology negative hematology ROS (+)   Anesthesia Other Findings   Reproductive/Obstetrics negative OB ROS                          Anesthesia Physical Anesthesia Plan  ASA: III  Anesthesia Plan: Spinal   Post-op Pain Management:    Induction:   Airway Management Planned: Simple Face Mask  Additional Equipment:   Intra-op Plan:   Post-operative Plan:   Informed Consent: I have reviewed the patients History and Physical, chart, labs and discussed the procedure including the risks, benefits and alternatives for the proposed anesthesia with the patient or authorized representative who has indicated his/her understanding and acceptance.   Dental Advisory Given  Plan Discussed with: CRNA and Surgeon  Anesthesia Plan Comments:        Anesthesia Quick Evaluation

## 2013-09-23 NOTE — Anesthesia Postprocedure Evaluation (Addendum)
  Anesthesia Post-op Note  Patient: Mary Garner  Procedure(s) Performed: Procedure(s) (LRB): LEFT TOTAL HIP ARTHROPLASTY ANTERIOR APPROACH (Left)  Patient Location: PACU  Anesthesia Type: spinal  Level of Consciousness: awake and alert   Airway and Oxygen Therapy: Patient Spontanous Breathing  Post-op Pain: mild  Post-op Assessment: Post-op Vital signs reviewed, Patient's Cardiovascular Status Stable, Respiratory Function Stable, Patent Airway and No signs of Nausea or vomiting  Last Vitals:  Filed Vitals:   09/23/13 1200  BP: 139/73  Pulse: 54  Temp: 36.3 C  Resp: 16    Post-op Vital Signs: stable   Complications: No apparent anesthesia complications

## 2013-09-23 NOTE — Interval H&P Note (Signed)
History and Physical Interval Note:  09/23/2013 8:09 AM  Mary Garner  has presented today for surgery, with the diagnosis of LEFT HIP OA  The various methods of treatment have been discussed with the patient and family. After consideration of risks, benefits and other options for treatment, the patient has consented to  Procedure(s): LEFT TOTAL HIP ARTHROPLASTY ANTERIOR APPROACH (Left) as a surgical intervention .  The patient's history has been reviewed, patient examined, no change in status, stable for surgery.  I have reviewed the patient's chart and labs.  Questions were answered to the patient's satisfaction.     [name]

## 2013-09-23 NOTE — Transfer of Care (Signed)
Immediate Anesthesia Transfer of Care Note  Patient: Mary Garner  Procedure(s) Performed: Procedure(s): LEFT TOTAL HIP ARTHROPLASTY ANTERIOR APPROACH (Left)  Patient Location: PACU  Anesthesia Type:Spinal  Level of Consciousness: sedated  Airway & Oxygen Therapy: Patient Spontanous Breathing and Patient connected to face mask oxygen  Post-op Assessment: Report given to PACU RN and Post -op Vital signs reviewed and stable  Post vital signs: Reviewed and stable  Complications: No apparent anesthesia complications

## 2013-09-24 ENCOUNTER — Encounter (HOSPITAL_COMMUNITY): Payer: Self-pay | Admitting: Orthopedic Surgery

## 2013-09-24 DIAGNOSIS — D500 Iron deficiency anemia secondary to blood loss (chronic): Secondary | ICD-10-CM | POA: Diagnosis not present

## 2013-09-24 DIAGNOSIS — E669 Obesity, unspecified: Secondary | ICD-10-CM | POA: Diagnosis present

## 2013-09-24 LAB — BASIC METABOLIC PANEL
BUN: 10 mg/dL (ref 6–23)
CALCIUM: 8.8 mg/dL (ref 8.4–10.5)
CO2: 27 meq/L (ref 19–32)
CREATININE: 0.59 mg/dL (ref 0.50–1.10)
Chloride: 104 mEq/L (ref 96–112)
GFR calc Af Amer: >90 mL/min (ref >90)
Glucose, Bld: 149 mg/dL — ABNORMAL HIGH (ref 70–99)
Potassium: 4.7 mEq/L (ref 3.7–5.3)
SODIUM: 140 meq/L (ref 137–147)

## 2013-09-24 LAB — CBC
HEMATOCRIT: 34.8 % — AB (ref 36.0–46.0)
HEMOGLOBIN: 11.2 g/dL — AB (ref 12.0–15.0)
MCH: 29.2 pg (ref 26.0–34.0)
MCHC: 32.2 g/dL (ref 30.0–36.0)
MCV: 90.6 fL (ref 78.0–100.0)
Platelets: 247 10*3/uL (ref 150–400)
RBC: 3.84 MIL/uL — ABNORMAL LOW (ref 3.87–5.11)
RDW: 13.6 % (ref 11.5–15.5)
WBC: 11.4 10*3/uL — ABNORMAL HIGH (ref 4.0–10.5)

## 2013-09-24 MED ORDER — DSS 100 MG PO CAPS: 100.0000 mg | ORAL_CAPSULE | Freq: Two times a day (BID) | ORAL | Status: DC

## 2013-09-24 MED ORDER — FERROUS SULFATE 325 (65 FE) MG PO TABS: 325.0000 mg | ORAL_TABLET | Freq: Three times a day (TID) | ORAL | Status: DC

## 2013-09-24 MED ORDER — METHOCARBAMOL 500 MG PO TABS: 500.0000 mg | ORAL_TABLET | Freq: Four times a day (QID) | ORAL | Status: DC | PRN

## 2013-09-24 MED ORDER — ASPIRIN 325 MG PO TBEC: 325.0000 mg | DELAYED_RELEASE_TABLET | Freq: Two times a day (BID) | ORAL | Status: AC

## 2013-09-24 MED ORDER — HYDROCODONE-ACETAMINOPHEN 7.5-325 MG PO TABS: 1.0000 | ORAL_TABLET | ORAL | Status: DC

## 2013-09-24 MED ORDER — POLYETHYLENE GLYCOL 3350 17 G PO PACK: 17.0000 g | PACK | Freq: Two times a day (BID) | ORAL | Status: DC

## 2013-09-24 NOTE — Progress Notes (Signed)
Physical Therapy Treatment Patient Details Name: Mary Garner MRN: [id number] DOB: Dec 18, 1950 Today's Date: 09/24/2013 Time: 1000-1024 PT Time Calculation (min): 24 min  PT Assessment / Plan / Recommendation  History of Present Illness L THA -direct anterior   PT Comments   Pt doing well  Follow Up Recommendations  Home health PT     Does the patient have the potential to tolerate intense rehabilitation     Barriers to Discharge        Equipment Recommendations  None recommended by PT    Recommendations for Other Services    Frequency 7X/week   Progress towards PT Goals Progress towards PT goals: Progressing toward goals  Plan Current plan remains appropriate    Precautions / Restrictions Precautions Precautions: None Restrictions Weight Bearing Restrictions: No Other Position/Activity Restrictions: WBAT   Pertinent Vitals/Pain Pain controlled    Mobility  Bed Mobility Overal bed mobility: Needs Assistance Bed Mobility: Supine to Sit;Sit to Supine Supine to sit: Modified independent (Device/Increase time) Sit to supine: Modified independent (Device/Increase time) General bed mobility comments: pt used UEs to assist LLE off bed Transfers Overall transfer level: Needs assistance Equipment used: Rolling walker (2 wheeled) Transfers: Sit to/from Stand Sit to Stand: Modified independent (Device/Increase time) General transfer comment: vcs to control descent when getting back to chair Ambulation/Gait Ambulation/Gait assistance: Modified independent (Device/Increase time);Supervision Ambulation Distance (Feet): 90 Feet Assistive device: Rolling walker (2 wheeled) Gait Pattern/deviations: Step-through pattern General Gait Details: VCs sequencing    Exercises Total Joint Exercises Ankle Circles/Pumps: AROM;Both;10 reps;Supine Quad Sets: Strengthening;Both;10 reps Short Arc Quad: AROM;Left;10 reps;Strengthening Heel Slides: AAROM;Left;10 reps;Supine (using sheet to  self assist) Hip ABduction/ADduction: AROM;Standing;10 reps;Strengthening Knee Flexion: AAROM;Left;10 reps;Standing Marching in Standing: Other (comment) (too painful) Standing Hip Extension: AROM;Left;10 reps   PT Diagnosis:    PT Problem List:   PT Treatment Interventions:     PT Goals (current goals can now be found in the care plan section) Acute Rehab PT Goals Patient Stated Goal: to garden PT Goal Formulation: With patient Time For Goal Achievement: 09/30/13 Potential to Achieve Goals: Good  Visit Information  Last PT Received On: 09/24/13 Assistance Needed: +1 History of Present Illness: L THA -direct anterior    Subjective Data  Patient Stated Goal: to garden   Cognition  Cognition Arousal/Alertness: Awake/alert Behavior During Therapy: WFL for tasks assessed/performed Overall Cognitive Status: Within Functional Limits for tasks assessed    Balance     End of Session PT - End of Session Equipment Utilized During Treatment: Gait belt Activity Tolerance: Patient tolerated treatment well Patient left: in bed;with call bell/phone within reach Nurse Communication: Mobility status   GP     [facility] 09/24/2013, 11:20 AM

## 2013-09-24 NOTE — Progress Notes (Signed)
   Subjective: 1 Day Post-Op Procedure(s) (LRB): LEFT TOTAL HIP ARTHROPLASTY ANTERIOR APPROACH (Left)   Patient reports pain as mild, pain controlled. No events throughout the night. Ready to be discharged home.   Objective:   VITALS:   Filed Vitals:   09/24/13 0458  BP: 110/64  Pulse: 77  Temp: 98 F (36.7 C)  Resp: 16    Neurovascular intact Dorsiflexion/Plantar flexion intact Incision: dressing C/D/I No cellulitis present Compartment soft  LABS  Recent Labs  09/24/13 0451  HGB 11.2*  HCT 34.8*  WBC 11.4*  PLT 247     Recent Labs  09/24/13 0451  NA 140  K 4.7  BUN 10  CREATININE 0.59  GLUCOSE 149*     Assessment/Plan: 1 Day Post-Op Procedure(s) (LRB): LEFT TOTAL HIP ARTHROPLASTY ANTERIOR APPROACH (Left) Foley cath d/c'ed Advance diet Up with therapy D/C IV fluids Discharge home with home health Follow up in 2 weeks at [facility]. Follow up with OLIN,Amron Guerrette D in 2 weeks.  Contact information:  [facility] [address], [place] [phone]    Expected ABLA  Treated with iron and will observe  Obese (BMI 30-39.9) Estimated body mass index is 31.67 kg/(m^2) as calculated from the following:   Height as of this encounter: 5\' 11"  (1.803 m).   Weight as of this encounter: 102.967 kg (227 lb). Patient also counseled that weight may inhibit the healing process Patient counseled that losing weight will help with future health issues        West Pugh. Anntionette Madkins   PAC  09/24/2013, 9:09 AM

## 2013-09-24 NOTE — Evaluation (Signed)
Occupational Therapy Evaluation Patient Details Name: Mary Garner MRN: [id number] DOB: [birth year] Today's Date: 09/24/2013 Time: 6578-4696 OT Time Calculation (min): 21 min  OT Assessment / Plan / Recommendation History of present illness L THA -direct anterior   Clinical Impression   Pt was admitted for the above surgery.  All education was completed.  Pt does not need any further OT at this time.      OT Assessment  Patient does not need any further OT services    Follow Up Recommendations  No OT follow up    Barriers to Discharge      Equipment Recommendations  None recommended by OT    Recommendations for Other Services    Frequency       Precautions / Restrictions Precautions Precautions: None Restrictions Weight Bearing Restrictions: No   Pertinent Vitals/Pain L hip sore but no pain.  Repositioned in chair    ADL  Grooming: Teeth care;Wash/dry face;Supervision/safety Where Assessed - Grooming: Supported standing Upper Body Bathing: Set up Where Assessed - Upper Body Bathing: Unsupported sitting Lower Body Bathing: Minimal assistance Where Assessed - Lower Body Bathing: Supported sit to stand Upper Body Dressing: Set up Where Assessed - Upper Body Dressing: Unsupported sitting Lower Body Dressing: Minimal assistance Where Assessed - Lower Body Dressing: Supported sit to Lobbyist: Supervision/safety Armed forces technical officer Method: Sit to Loss adjuster, chartered: Raised toilet seat with arms (or 3-in-1 over toilet) Toileting - Clothing Manipulation and Hygiene: Supervision/safety Where Assessed - Best boy and Hygiene: Sit to stand from 3-in-1 or toilet Tub/Shower Transfer: Supervision/safety Tub/Shower Transfer Method: Therapist, art: Walk in Engineer, site Used: Rolling walker Transfers/Ambulation Related to ADLs: ambulated to bathroom with supervision ADL Comments: Pt is very independent.   Will not use reacher for ADLs.  Daughter will assist with socks.  Pt plans to stand in shower and will keep both feet on floor for safety    OT Diagnosis:    OT Problem List:   OT Treatment Interventions:     OT Goals(Current goals can be found in the care plan section)    Visit Information  Last OT Received On: 09/24/13 Assistance Needed: +1 History of Present Illness: L THA -direct anterior       Prior Functioning     Home Living Family/patient expects to be discharged to:: Private residence Living Arrangements: [name]: Toilet riser (with arms and reacher) Prior Function Level of Independence: Independent with assistive device(s) Communication Communication: No difficulties Dominant Hand: Right         Vision/Perception     Cognition  Cognition Arousal/Alertness: Awake/alert Behavior During Therapy: WFL for tasks assessed/performed Overall Cognitive Status: Within Functional Limits for tasks assessed    Extremity/Trunk Assessment Upper Extremity Assessment Upper Extremity Assessment: Overall WFL for tasks assessed     Mobility Bed Mobility Supine to sit: Supervision General bed mobility comments: pt used UEs to assist LLE off bed Transfers Transfers: Sit to/from Stand Sit to Stand: Supervision General transfer comment: vcs to control descent when getting back to chair     Exercise     Balance     End of Session OT - End of Session Activity Tolerance: Patient tolerated treatment well Patient left: in chair;with call bell/phone within reach  [place] 09/24/2013, 8:36 AM Lesle Chris, OTR/L [phone] 09/24/2013

## 2013-09-24 NOTE — Progress Notes (Signed)
[facility]  [facility] is providing the following services: Patient has both rw and commode at home.   If patient discharges after hours, please call [phone].   Linward Headland 09/24/2013, 11:05 AM

## 2013-09-24 NOTE — Progress Notes (Signed)
   CARE MANAGEMENT NOTE 09/24/2013  Patient:  Mary Garner, Mary Garner   Account Number:  [id number]  Date Initiated:  09/24/2013  Documentation initiated by:  [facility]  Subjective/Objective Assessment:   LEFT TOTAL HIP ARTHROPLASTY ANTERIOR APPROACH     Action/Plan:   [place]   Anticipated DC Date:  09/24/2013   Anticipated DC Plan:  [place]  CM consult      [facility] Choice  HOME HEALTH   Choice offered to / List presented to:  C-1 Patient        [facility] arranged  [facility]      [place].   Status of service:  Completed, signed off Medicare Important Message given?   (If response is "NO", the following Medicare IM given date fields will be blank) Date Medicare IM given:   Date Additional Medicare IM given:    Discharge Disposition:  [place]  Per UR Regulation:    If discussed at Long Length of Stay Meetings, dates discussed:    Comments:  09/23/2013 1000 NCM spoke to pt and offered choice for [facility]. Pt agreeable to [facility] for HH. States she has RW and 3n1 at home. Notified [facility] [place] for scheduled dc home today. Jonnie Finner RN CCM Case Mgmt phone [phone]

## 2013-09-29 NOTE — Discharge Summary (Signed)
Physician Discharge Summary  Patient ID: Mary Garner MRN: [id number] DOB/AGE: 62/22/1952 62 y.o.  Admit date: 09/23/2013 Discharge date: 09/24/2013   Procedures:  Procedure(s) (LRB): LEFT TOTAL HIP ARTHROPLASTY ANTERIOR APPROACH (Left)  Attending Physician:  Dr. Paralee Cancel   Admission Diagnoses:   Left hip OA / pain  Discharge Diagnoses:  Active Problems:   S/P left THA, AA   Expected blood loss anemia   Obese  Past Medical History  Diagnosis Date  . Hypertension   . Anxiety   . Sleep apnea     not diagnosed, sleep study in April, occasionally stops breathing  . GERD (gastroesophageal reflux disease)   . Arthritis   . Cancer     basal cell on face    HPI: Mary Garner, 63 y.o. female, has a history of pain and functional disability in the left hip(s) due to arthritis and patient has failed non-surgical conservative treatments for greater than 12 weeks to include NSAID's and/or analgesics, corticosteriod injections and activity modification. Onset of symptoms was gradual starting 1+ years ago with gradually worsening course since that time.The patient noted no past surgery on the left hip(s). Patient currently rates pain in the left hip at 10 out of 10 with activity. Patient has night pain, worsening of pain with activity and weight bearing, trendelenberg gait, pain that interfers with activities of daily living and pain with passive range of motion. Patient has evidence of periarticular osteophytes and joint space narrowing by imaging studies. This condition presents safety issues increasing the risk of falls. There is no current active infection. Risks, benefits and expectations were discussed with the patient. Risks including but not limited to the risk of anesthesia, blood clots, nerve damage, blood vessel damage, failure of the prosthesis, infection and up to and including death. Patient understand the risks, benefits and expectations and wishes to proceed with surgery.    PCP: [name], NP   Discharged Condition: good  Hospital Course:  Patient underwent the above stated procedure on 09/23/2013. Patient tolerated the procedure well and brought to the recovery room in good condition and subsequently to the floor.  POD #1 BP: 110/64 ; Pulse: 77 ; Temp: 98 F (36.7 C) ; Resp: 16  Patient reports pain as mild, pain controlled. No events throughout the night. Ready to be discharged home. Neurovascular intact, dorsiflexion/plantar flexion intact, incision: dressing C/D/I, no cellulitis present and compartment soft.   LABS  Basename    HGB  11.2  HCT  34.8    Discharge Exam: General appearance: alert, cooperative and no distress Extremities: Homans sign is negative, no sign of DVT, no edema, redness or tenderness in the calves or thighs and no ulcers, gangrene or trophic changes  Disposition:   Home-Health Care Svc with follow up in 2 weeks   Follow-up Information   Follow up with [name], MD. Schedule an appointment as soon as possible for a visit in 2 weeks.   Specialty:  Orthopedic Surgery   Contact information:   [address] [place] [address] [phone]       Follow up with [place]. [facility])    Contact information:   [address] [place] [address] [phone]       Discharge Orders   Future Orders Complete By Expires   Call MD / Call 911  As directed    Comments:     If you experience chest pain or shortness of  breath, CALL 911 and be transported to the hospital emergency room.  If you develope a fever above 101 F, pus (white drainage) or increased drainage or redness at the wound, or calf pain, call your surgeon's office.   Change dressing  As directed    Comments:     Maintain surgical dressing for 10-14 days, or until follow up in the clinic.   Constipation Prevention  As directed    Comments:     Drink plenty of fluids.  Prune juice  may be helpful.  You may use a stool softener, such as Colace (over the counter) 100 mg twice a day.  Use MiraLax (over the counter) for constipation as needed.   Diet - low sodium heart healthy  As directed    Discharge instructions  As directed    Comments:     Maintain surgical dressing for 10-14 days, or until follow up in the clinic. Follow up in 2 weeks at [facility]. Call with any questions or concerns.   Increase activity slowly as tolerated  As directed    TED hose  As directed    Comments:     Use stockings (TED hose) for 2 weeks on both leg(s).  You may remove them at night for sleeping.   Weight bearing as tolerated  As directed    Questions:     Laterality:     Extremity:          Medication List    STOP taking these medications       HYDROcodone-acetaminophen 5-325 MG per tablet  Commonly known as:  NORCO/VICODIN  Replaced by:  HYDROcodone-acetaminophen 7.5-325 MG per tablet     ibuprofen 200 MG tablet  Commonly known as:  ADVIL,MOTRIN      TAKE these medications       ALPRAZolam 0.5 MG tablet  Commonly known as:  XANAX  Take 0.5 mg by mouth 2 (two) times daily as needed for anxiety.     amLODipine 5 MG tablet  Commonly known as:  NORVASC  Take 5 mg by mouth every morning.     aspirin 325 MG EC tablet  Take 1 tablet (325 mg total) by mouth 2 (two) times daily.     DSS 100 MG Caps  Take 100 mg by mouth 2 (two) times daily.     famotidine 20 MG tablet  Commonly known as:  PEPCID  Take 20 mg by mouth as needed for heartburn or indigestion.     ferrous sulfate 325 (65 FE) MG tablet  Take 1 tablet (325 mg total) by mouth 3 (three) times daily after meals.     HYDROcodone-acetaminophen 7.5-325 MG per tablet  Commonly known as:  NORCO  Take 1-2 tablets by mouth every 4 (four) hours.     lisinopril 30 MG tablet  Commonly known as:  PRINIVIL,ZESTRIL  Take 30 mg by mouth every morning.     methocarbamol 500 MG tablet  Commonly known as:   ROBAXIN  Take 1 tablet (500 mg total) by mouth every 6 (six) hours as needed for muscle spasms.     polyethylene glycol packet  Commonly known as:  MIRALAX / GLYCOLAX  Take 17 g by mouth 2 (two) times daily.         Signed: [place]. Yamaira Spinner   PAC  09/29/2013, 11:11 AM

## 2015-08-24 NOTE — Patient Instructions (Signed)
Mary Garner  08/24/2015   Your procedure is scheduled on: 09/06/2015   Report to [facility] Main  Entrance take [place]  elevators to 3rd floor to  [place] at    [phone] AM.  Call this number if you have problems the morning of surgery [phone]   Remember: ONLY 1 PERSON MAY GO WITH YOU TO SHORT STAY TO GET  READY MORNING OF [place].  Do not eat food or drink liquids :After Midnight.     Take these medicines the morning of surgery with A SIP OF WATER: Xanax if needed, pepcid if needed                                 You may not have any metal on your body including hair pins and              piercings  Do not wear jewelry, make-up, lotions, powders or perfumes, deodorant             Do not wear nail polish.  Do not shave  48 hours prior to surgery.             Do not bring valuables to the hospital. [place].  Contacts, dentures or bridgework may not be worn into surgery.  Leave suitcase in the car. After surgery it may be brought to your room.      Special Instructions:coughing and deep breathing exercises, leg exercises               Please read over the following fact sheets you were given: _____________________________________________________________________             [facility] - Preparing for Surgery Before surgery, you can play an important role.  Because skin is not sterile, your skin needs to be as free of germs as possible.  You can reduce the number of germs on your skin by washing with CHG (chlorahexidine gluconate) soap before surgery.  CHG is an antiseptic cleaner which kills germs and bonds with the skin to continue killing germs even after washing. Please DO NOT use if you have an allergy to CHG or antibacterial soaps.  If your skin becomes reddened/irritated stop using the CHG and inform your nurse when you arrive at Short Stay. Do not shave (including legs and underarms)  for at least 48 hours prior to the first CHG shower.  You may shave your face/neck. Please follow these instructions carefully:  1.  Shower with CHG Soap the night before surgery and the  morning of Surgery.  2.  If you choose to wash your hair, wash your hair first as usual with your  normal  shampoo.  3.  After you shampoo, rinse your hair and body thoroughly to remove the  shampoo.                           4.  Use CHG as you would any other liquid soap.  You can apply chg directly  to the skin and wash                       Gently with a  scrungie or clean washcloth.  5.  Apply the CHG Soap to your body ONLY FROM THE NECK DOWN.   Do not use on face/ open                           Wound or open sores. Avoid contact with eyes, ears mouth and genitals (private parts).                       Wash face,  Genitals (private parts) with your normal soap.             6.  Wash thoroughly, paying special attention to the area where your surgery  will be performed.  7.  Thoroughly rinse your body with warm water from the neck down.  8.  DO NOT shower/wash with your normal soap after using and rinsing off  the CHG Soap.                9.  Pat yourself dry with a clean towel.            10.  Wear clean pajamas.            11.  Place clean sheets on your bed the night of your first shower and do not  sleep with pets. Day of Surgery : Do not apply any lotions/deodorants the morning of surgery.  Please wear clean clothes to the hospital/surgery center.  FAILURE TO FOLLOW THESE INSTRUCTIONS MAY RESULT IN THE CANCELLATION OF YOUR SURGERY PATIENT SIGNATURE_________________________________  NURSE SIGNATURE__________________________________  ________________________________________________________________________  WHAT IS A BLOOD TRANSFUSION? Blood Transfusion Information  A transfusion is the replacement of blood or some of its parts. Blood is made up of multiple cells which provide different  functions.  Red blood cells carry oxygen and are used for blood loss replacement.  White blood cells fight against infection.  Platelets control bleeding.  Plasma helps clot blood.  Other blood products are available for specialized needs, such as hemophilia or other clotting disorders. BEFORE THE TRANSFUSION  Who gives blood for transfusions?   Healthy volunteers who are fully evaluated to make sure their blood is safe. This is blood bank blood. Transfusion therapy is the safest it has ever been in the practice of medicine. Before blood is taken from a donor, a complete history is taken to make sure that person has no history of diseases nor engages in risky social behavior (examples are intravenous drug use or sexual activity with multiple partners). The donor's travel history is screened to minimize risk of transmitting infections, such as malaria. The donated blood is tested for signs of infectious diseases, such as HIV and hepatitis. The blood is then tested to be sure it is compatible with you in order to minimize the chance of a transfusion reaction. If you or a relative donates blood, this is often done in anticipation of surgery and is not appropriate for emergency situations. It takes many days to process the donated blood. RISKS AND COMPLICATIONS Although transfusion therapy is very safe and saves many lives, the main dangers of transfusion include:  1. Getting an infectious disease. 2. Developing a transfusion reaction. This is an allergic reaction to something in the blood you were given. Every precaution is taken to prevent this. The decision to have a blood transfusion has been considered carefully by your caregiver before blood is given. Blood is not given unless the benefits outweigh the risks. AFTER  THE TRANSFUSION  Right after receiving a blood transfusion, you will usually feel much better and more energetic. This is especially true if your red blood cells have gotten low  (anemic). The transfusion raises the level of the red blood cells which carry oxygen, and this usually causes an energy increase.  The nurse administering the transfusion will monitor you carefully for complications. HOME CARE INSTRUCTIONS  No special instructions are needed after a transfusion. You may find your energy is better. Speak with your caregiver about any limitations on activity for underlying diseases you may have. SEEK MEDICAL CARE IF:   Your condition is not improving after your transfusion.  You develop redness or irritation at the intravenous (IV) site. SEEK IMMEDIATE MEDICAL CARE IF:  Any of the following symptoms occur over the next 12 hours:  Shaking chills.  You have a temperature by mouth above 102 F (38.9 C), not controlled by medicine.  Chest, back, or muscle pain.  People around you feel you are not acting correctly or are confused.  Shortness of breath or difficulty breathing.  Dizziness and fainting.  You get a rash or develop hives.  You have a decrease in urine output.  Your urine turns a dark color or changes to pink, red, or brown. Any of the following symptoms occur over the next 10 days:  You have a temperature by mouth above 102 F (38.9 C), not controlled by medicine.  Shortness of breath.  Weakness after normal activity.  The white part of the eye turns yellow (jaundice).  You have a decrease in the amount of urine or are urinating less often.  Your urine turns a dark color or changes to pink, red, or brown. Document Released: 06/23/2000 Document Revised: 09/18/2011 Document Reviewed: 02/10/2008 ExitCare Patient Information 2014 [place].  _______________________________________________________________________  Incentive Spirometer  An incentive spirometer is a tool that can help keep your lungs clear and active. This tool measures how well you are filling your lungs with each breath. Taking long deep breaths may help  reverse or decrease the chance of developing breathing (pulmonary) problems (especially infection) following:  A long period of time when you are unable to move or be active. BEFORE THE PROCEDURE   If the spirometer includes an indicator to show your best effort, your nurse or respiratory therapist will set it to a desired goal.  If possible, sit up straight or lean slightly forward. Try not to slouch.  Hold the incentive spirometer in an upright position. INSTRUCTIONS FOR USE  3. Sit on the edge of your bed if possible, or sit up as far as you can in bed or on a chair. 4. Hold the incentive spirometer in an upright position. 5. Breathe out normally. 6. Place the mouthpiece in your mouth and seal your lips tightly around it. 7. Breathe in slowly and as deeply as possible, raising the piston or the ball toward the top of the column. 8. Hold your breath for 3-5 seconds or for as long as possible. Allow the piston or ball to fall to the bottom of the column. 9. Remove the mouthpiece from your mouth and breathe out normally. 10. Rest for a few seconds and repeat Steps 1 through 7 at least 10 times every 1-2 hours when you are awake. Take your time and take a few normal breaths between deep breaths. 11. The spirometer may include an indicator to show your best effort. Use the indicator as a goal to work toward during each  repetition. 12. After each set of 10 deep breaths, practice coughing to be sure your lungs are clear. If you have an incision (the cut made at the time of surgery), support your incision when coughing by placing a pillow or rolled up towels firmly against it. Once you are able to get out of bed, walk around indoors and cough well. You may stop using the incentive spirometer when instructed by your caregiver.  RISKS AND COMPLICATIONS  Take your time so you do not get dizzy or light-headed.  If you are in pain, you may need to take or ask for pain medication before doing  incentive spirometry. It is harder to take a deep breath if you are having pain. AFTER USE  Rest and breathe slowly and easily.  It can be helpful to keep track of a log of your progress. Your caregiver can provide you with a simple table to help with this. If you are using the spirometer at home, follow these instructions: [place] IF:   You are having difficultly using the spirometer.  You have trouble using the spirometer as often as instructed.  Your pain medication is not giving enough relief while using the spirometer.  You develop fever of 100.5 F (38.1 C) or higher. SEEK IMMEDIATE MEDICAL CARE IF:   You cough up bloody sputum that had not been present before.  You develop fever of 102 F (38.9 C) or greater.  You develop worsening pain at or near the incision site. MAKE SURE YOU:   Understand these instructions.  Will watch your condition.  Will get help right away if you are not doing well or get worse. Document Released: 11/06/2006 Document Revised: 09/18/2011 Document Reviewed: 01/07/2007 [facility] Patient Information 2014 [place], [place].   ________________________________________________________________________

## 2015-08-25 ENCOUNTER — Encounter (HOSPITAL_COMMUNITY)
Admission: RE | Admit: 2015-08-25 | Discharge: 2015-08-25 | Disposition: A | Discharge status: Home / Self Care | Payer: 59 | Source: Ambulatory Visit | Attending: Orthopedic Surgery | Admitting: Orthopedic Surgery

## 2015-08-25 ENCOUNTER — Encounter (HOSPITAL_COMMUNITY): Payer: Self-pay

## 2015-08-25 DIAGNOSIS — M1712 Unilateral primary osteoarthritis, left knee: Secondary | ICD-10-CM | POA: Insufficient documentation

## 2015-08-25 DIAGNOSIS — Z01812 Encounter for preprocedural laboratory examination: Secondary | ICD-10-CM | POA: Insufficient documentation

## 2015-08-25 DIAGNOSIS — Z0183 Encounter for blood typing: Secondary | ICD-10-CM | POA: Insufficient documentation

## 2015-08-25 LAB — CBC
HCT: 41.3 % (ref 36.0–46.0)
Hemoglobin: 13.5 g/dL (ref 12.0–15.0)
MCH: 29.9 pg (ref 26.0–34.0)
MCHC: 32.7 g/dL (ref 30.0–36.0)
MCV: 91.6 fL (ref 78.0–100.0)
Platelets: 317 10*3/uL (ref 150–400)
RBC: 4.51 MIL/uL (ref 3.87–5.11)
RDW: 13.5 % (ref 11.5–15.5)
WBC: 7.4 10*3/uL (ref 4.0–10.5)

## 2015-08-25 LAB — URINALYSIS, ROUTINE W REFLEX MICROSCOPIC
Bilirubin Urine: NEGATIVE
Glucose, UA: NEGATIVE mg/dL
Hgb urine dipstick: NEGATIVE
Ketones, ur: NEGATIVE mg/dL
LEUKOCYTES UA: NEGATIVE
NITRITE: NEGATIVE
Protein, ur: NEGATIVE mg/dL
SPECIFIC GRAVITY, URINE: 1.019 (ref 1.005–1.030)
pH: 5.5 (ref 5.0–8.0)

## 2015-08-25 LAB — BASIC METABOLIC PANEL
Anion gap: 9 (ref 5–15)
BUN: 21 mg/dL — AB (ref 6–20)
CHLORIDE: 101 mmol/L (ref 101–111)
CO2: 26 mmol/L (ref 22–32)
CREATININE: 0.73 mg/dL (ref 0.44–1.00)
Calcium: 9.2 mg/dL (ref 8.9–10.3)
GFR calc Af Amer: >60 mL/min (ref >60)
GFR calc non Af Amer: >60 mL/min (ref >60)
Glucose, Bld: 99 mg/dL (ref 65–99)
Potassium: 4.2 mmol/L (ref 3.5–5.1)
SODIUM: 136 mmol/L (ref 135–145)

## 2015-08-25 LAB — TYPE AND SCREEN
ABO/RH(D): A POS
Antibody Screen: NEGATIVE

## 2015-08-25 LAB — SURGICAL PCR SCREEN
MRSA, PCR: NEGATIVE
Staphylococcus aureus: NEGATIVE

## 2015-08-25 LAB — PROTIME-INR
INR: 0.92 (ref 0.00–1.49)
Prothrombin Time: 12.5 s (ref 11.6–15.2)

## 2015-08-25 LAB — APTT: aPTT: 25 seconds (ref 24–37)

## 2015-08-25 NOTE — Progress Notes (Signed)
EKG-07/30/15- chart DR [name] - clearance on chart

## 2015-08-27 NOTE — H&P (Signed)
TOTAL KNEE ADMISSION H&P  Patient is being admitted for left total knee arthroplasty.  Subjective:  Chief Complaint:    Left knee primary OA / pain  HPI: Mary Garner, 65 y.o. female, has a history of pain and functional disability in the left knee due to arthritis and has failed non-surgical conservative treatments for greater than 12 weeks to include NSAID's and/or analgesics and activity modification.  Onset of symptoms was gradual, starting 2+ years ago with gradually worsening course since that time. The patient noted no past surgery on the left knee(s).  Patient currently rates pain in the left knee(s) at 9 out of 10 with activity. Patient has worsening of pain with activity and weight bearing, pain that interferes with activities of daily living, pain with passive range of motion, crepitus and joint swelling.  Patient has evidence of periarticular osteophytes and joint space narrowing by imaging studies.  There is no active infection.   Risks, benefits and expectations were discussed with the patient.  Risks including but not limited to the risk of anesthesia, blood clots, nerve damage, blood vessel damage, failure of the prosthesis, infection and up to and including death.  Patient understand the risks, benefits and expectations and wishes to proceed with surgery.   PCP: Mary Polio, NP  D/C Plans:      Home with HHPT  Post-op Meds:       No Rx given   Tranexamic Acid:      To be given - IV  Decadron:      Is to be given  FYI:     ASA  Norco  CPAP    Patient Active Problem List   Diagnosis Date Noted  . Expected blood loss anemia 09/24/2013  . Obese 09/24/2013  . S/P left THA, AA 09/23/2013   Past Medical History  Diagnosis Date  . Hypertension   . Anxiety   . GERD (gastroesophageal reflux disease)   . Arthritis   . 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 Sleep apnea     cpap- 3.5     Past Surgical History  Procedure Laterality Date  . Basal cell carcinoma  excision  10 or 12 years ago    x3   . Tubal ligation  30 years ago  . Total hip arthroplasty Left 09/23/2013    Procedure: LEFT TOTAL HIP ARTHROPLASTY ANTERIOR APPROACH;  Surgeon: [name], MD;  Location: [facility];  Service: Orthopedics;  Laterality: Left;    No prescriptions prior to admission   Allergies  Allergen Reactions  . Hctz [Hydrochlorothiazide]     Dizziness     Social History  Substance Use Topics  . Smoking status: Former Research scientist (life sciences)  . Smokeless tobacco: Never Used  . Alcohol Use: 0.0 oz/week     Comment: beer or wine on weekend        Review of Systems  Constitutional: Negative.   HENT: Negative.   Eyes: Negative.   Respiratory: Negative.   Cardiovascular: Negative.   Gastrointestinal: Positive for heartburn.  Genitourinary: Negative.   Musculoskeletal: Positive for joint pain.  Skin: Negative.   Neurological: Negative.   Endo/Heme/Allergies: Negative.   Psychiatric/Behavioral: The patient is nervous/anxious.     Objective:  Physical Exam  Constitutional: She is oriented to person, place, and time. She appears well-developed.  HENT:  Head: Normocephalic.  Eyes: Pupils are equal, round, and reactive to light.  Neck: Neck supple. No JVD present. No tracheal deviation present. No  thyromegaly present.  Cardiovascular: Normal rate, regular rhythm, normal heart sounds and intact distal pulses.   Respiratory: Effort normal and breath sounds normal. No stridor. No respiratory distress. She has no wheezes.  GI: Soft. There is no tenderness. There is no guarding.  Musculoskeletal:       Left knee: She exhibits decreased range of motion, swelling, abnormal alignment and bony tenderness. She exhibits no ecchymosis, no deformity, no laceration and no erythema. Tenderness found.  Lymphadenopathy:    She has no cervical adenopathy.  Neurological: She is alert and oriented to person, place, and time.  Skin: Skin is warm and dry.  Psychiatric: She has a normal mood  and affect.      Labs:  Estimated body mass index is 31.67 kg/(m^2) as calculated from the following:   Height as of 09/23/13: 5\' 11"  ([id number] m).   Weight as of 09/16/13: 102.967 kg (227 lb).   Imaging Review Plain radiographs demonstrate severe degenerative joint disease of the left knee(s). The overall alignment is significant valgus. The bone quality appears to be good for age and reported activity level.  Assessment/Plan:  End stage arthritis, left knee   The patient history, physical examination, clinical judgment of the provider and imaging studies are consistent with end stage degenerative joint disease of the left knee(s) and total knee arthroplasty is deemed medically necessary. The treatment options including medical management, injection therapy arthroscopy and arthroplasty were discussed at length. The risks and benefits of total knee arthroplasty were presented and reviewed. The risks due to aseptic loosening, infection, stiffness, patella tracking problems, thromboembolic complications and other imponderables were discussed. The patient acknowledged the explanation, agreed to proceed with the plan and consent was signed. Patient is being admitted for inpatient treatment for surgery, pain control, PT, OT, prophylactic antibiotics, VTE prophylaxis, progressive ambulation and ADL's and discharge planning. The patient is planning to be discharged home with home health services.      Mary Pugh Emery Dupuy   PA-C  08/27/2015, 10:59 AM

## 2015-09-02 NOTE — Progress Notes (Signed)
Patient made aware that new surgery time is 12noon-1330pm on 09/06/2015 and she is to arrive at 0900am.  Patient voiced understanding.

## 2015-09-06 ENCOUNTER — Inpatient Hospital Stay (HOSPITAL_COMMUNITY)
Admission: RE | Admit: 2015-09-06 | Discharge: 2015-09-07 | DRG: 470 | Disposition: A | Discharge status: Home Health | Payer: 59 | Source: Ambulatory Visit | Attending: Orthopedic Surgery | Admitting: Orthopedic Surgery

## 2015-09-06 ENCOUNTER — Inpatient Hospital Stay (HOSPITAL_COMMUNITY): Payer: 59 | Admitting: Certified Registered"

## 2015-09-06 ENCOUNTER — Encounter (HOSPITAL_COMMUNITY): Payer: Self-pay | Admitting: *Deleted

## 2015-09-06 ENCOUNTER — Encounter (HOSPITAL_COMMUNITY)
Admission: AD | Disposition: A | Discharge status: Home Health | Payer: Self-pay | Source: Ambulatory Visit | Attending: Orthopedic Surgery

## 2015-09-06 DIAGNOSIS — K219 Gastro-esophageal reflux disease without esophagitis: Secondary | ICD-10-CM | POA: Diagnosis present

## 2015-09-06 DIAGNOSIS — E669 Obesity, unspecified: Secondary | ICD-10-CM | POA: Diagnosis present

## 2015-09-06 DIAGNOSIS — Z96659 Presence of unspecified artificial knee joint: Secondary | ICD-10-CM

## 2015-09-06 DIAGNOSIS — Z87891 Personal history of nicotine dependence: Secondary | ICD-10-CM | POA: Diagnosis not present

## 2015-09-06 DIAGNOSIS — Z96642 Presence of left artificial hip joint: Secondary | ICD-10-CM | POA: Diagnosis present

## 2015-09-06 DIAGNOSIS — M1712 Unilateral primary osteoarthritis, left knee: Principal | ICD-10-CM | POA: Diagnosis present

## 2015-09-06 DIAGNOSIS — Z01812 Encounter for preprocedural laboratory examination: Secondary | ICD-10-CM | POA: Diagnosis not present

## 2015-09-06 DIAGNOSIS — Z6831 Body mass index (BMI) 31.0-31.9, adult: Secondary | ICD-10-CM | POA: Diagnosis not present

## 2015-09-06 DIAGNOSIS — G4730 Sleep apnea, unspecified: Secondary | ICD-10-CM | POA: Diagnosis present

## 2015-09-06 DIAGNOSIS — I10 Essential (primary) hypertension: Secondary | ICD-10-CM | POA: Diagnosis present

## 2015-09-06 DIAGNOSIS — M25562 Pain in left knee: Secondary | ICD-10-CM | POA: Diagnosis present

## 2015-09-06 HISTORY — PX: TOTAL KNEE ARTHROPLASTY: SHX125

## 2015-09-06 SURGERY — ARTHROPLASTY, KNEE, TOTAL
Anesthesia: Spinal | Site: Knee | Laterality: Left

## 2015-09-06 MED ORDER — OXYCODONE HCL 5 MG PO TABS: 5.0000 mg | ORAL_TABLET | Freq: Once | ORAL | Status: DC | PRN

## 2015-09-06 MED ORDER — METHOCARBAMOL 500 MG PO TABS
500.0000 mg | ORAL_TABLET | Freq: Four times a day (QID) | ORAL | Status: DC | PRN
  Administered 2015-09-06: 500 mg via ORAL
  Filled 2015-09-06: qty 1

## 2015-09-06 MED ORDER — SODIUM CHLORIDE 0.9 % IR SOLN
Status: DC | PRN
  Administered 2015-09-06: 1000 mL

## 2015-09-06 MED ORDER — ONDANSETRON HCL 4 MG/2ML IJ SOLN
INTRAMUSCULAR | Status: AC
  Filled 2015-09-06: qty 2

## 2015-09-06 MED ORDER — PROPOFOL 10 MG/ML IV BOLUS
INTRAVENOUS | Status: AC
  Filled 2015-09-06: qty 20

## 2015-09-06 MED ORDER — SODIUM CHLORIDE 0.9 % IJ SOLN
INTRAMUSCULAR | Status: AC
  Filled 2015-09-06: qty 50

## 2015-09-06 MED ORDER — DOCUSATE SODIUM 100 MG PO CAPS
100.0000 mg | ORAL_CAPSULE | Freq: Two times a day (BID) | ORAL | Status: DC
  Administered 2015-09-06 – 2015-09-07 (×2): 100 mg via ORAL

## 2015-09-06 MED ORDER — LACTATED RINGERS IV SOLN
INTRAVENOUS | Status: DC | PRN
  Administered 2015-09-06 (×4): via INTRAVENOUS

## 2015-09-06 MED ORDER — PROPOFOL 10 MG/ML IV BOLUS
INTRAVENOUS | Status: AC
  Filled 2015-09-06: qty 40

## 2015-09-06 MED ORDER — ACETAMINOPHEN 325 MG PO TABS: 325.0000 mg | ORAL_TABLET | ORAL | Status: DC | PRN

## 2015-09-06 MED ORDER — METHOCARBAMOL 1000 MG/10ML IJ SOLN
500.0000 mg | Freq: Four times a day (QID) | INTRAMUSCULAR | Status: DC | PRN
  Administered 2015-09-06: 500 mg via INTRAVENOUS
  Filled 2015-09-06 (×2): qty 5

## 2015-09-06 MED ORDER — ALPRAZOLAM 0.5 MG PO TABS
0.5000 mg | ORAL_TABLET | Freq: Two times a day (BID) | ORAL | Status: DC | PRN
  Administered 2015-09-06: 0.5 mg via ORAL
  Filled 2015-09-06: qty 1

## 2015-09-06 MED ORDER — HYDROMORPHONE HCL 1 MG/ML IJ SOLN
0.2500 mg | INTRAMUSCULAR | Status: DC | PRN
  Administered 2015-09-06: 0.5 mg via INTRAVENOUS

## 2015-09-06 MED ORDER — KETOROLAC TROMETHAMINE 30 MG/ML IJ SOLN
INTRAMUSCULAR | Status: AC
  Filled 2015-09-06: qty 1

## 2015-09-06 MED ORDER — BUPIVACAINE IN DEXTROSE 0.75-8.25 % IT SOLN
INTRATHECAL | Status: DC | PRN
  Administered 2015-09-06: 2 mL via INTRATHECAL

## 2015-09-06 MED ORDER — LIDOCAINE HCL (CARDIAC) 20 MG/ML IV SOLN
INTRAVENOUS | Status: DC | PRN
  Administered 2015-09-06: 40 mg via INTRAVENOUS

## 2015-09-06 MED ORDER — PHENYLEPHRINE 40 MCG/ML (10ML) SYRINGE FOR IV PUSH (FOR BLOOD PRESSURE SUPPORT)
PREFILLED_SYRINGE | INTRAVENOUS | Status: AC
  Filled 2015-09-06: qty 10

## 2015-09-06 MED ORDER — TRANEXAMIC ACID 1000 MG/10ML IV SOLN
1000.0000 mg | Freq: Once | INTRAVENOUS | Status: AC
  Administered 2015-09-06: 1000 mg via INTRAVENOUS
  Filled 2015-09-06: qty 10

## 2015-09-06 MED ORDER — ONDANSETRON HCL 4 MG PO TABS
4.0000 mg | ORAL_TABLET | Freq: Four times a day (QID) | ORAL | Status: DC | PRN
  Administered 2015-09-07: 4 mg via ORAL
  Filled 2015-09-06: qty 1

## 2015-09-06 MED ORDER — POTASSIUM CHLORIDE CRYS ER 10 MEQ PO TBCR
10.0000 meq | EXTENDED_RELEASE_TABLET | Freq: Every day | ORAL | Status: DC
  Administered 2015-09-07: 10 meq via ORAL
  Filled 2015-09-06: qty 1

## 2015-09-06 MED ORDER — FERROUS SULFATE 325 (65 FE) MG PO TABS
325.0000 mg | ORAL_TABLET | Freq: Three times a day (TID) | ORAL | Status: DC
  Administered 2015-09-07: 325 mg via ORAL
  Filled 2015-09-06 (×5): qty 1

## 2015-09-06 MED ORDER — DEXAMETHASONE SODIUM PHOSPHATE 10 MG/ML IJ SOLN
10.0000 mg | Freq: Once | INTRAMUSCULAR | Status: AC
  Administered 2015-09-06: 10 mg via INTRAVENOUS

## 2015-09-06 MED ORDER — METOCLOPRAMIDE HCL 10 MG PO TABS: 5.0000 mg | ORAL_TABLET | Freq: Three times a day (TID) | ORAL | Status: DC | PRN

## 2015-09-06 MED ORDER — DEXAMETHASONE SODIUM PHOSPHATE 10 MG/ML IJ SOLN
INTRAMUSCULAR | Status: AC
  Filled 2015-09-06: qty 1

## 2015-09-06 MED ORDER — FAMOTIDINE 20 MG PO TABS
20.0000 mg | ORAL_TABLET | Freq: Every day | ORAL | Status: DC | PRN
  Filled 2015-09-06: qty 1

## 2015-09-06 MED ORDER — BUPIVACAINE-EPINEPHRINE (PF) 0.25% -1:200000 IJ SOLN
INTRAMUSCULAR | Status: AC
  Filled 2015-09-06: qty 30

## 2015-09-06 MED ORDER — METOCLOPRAMIDE HCL 5 MG/ML IJ SOLN: 5.0000 mg | Freq: Three times a day (TID) | INTRAMUSCULAR | Status: DC | PRN

## 2015-09-06 MED ORDER — MIDAZOLAM HCL 5 MG/5ML IJ SOLN
INTRAMUSCULAR | Status: DC | PRN
  Administered 2015-09-06: 2 mg via INTRAVENOUS

## 2015-09-06 MED ORDER — CEFAZOLIN SODIUM-DEXTROSE 2-3 GM-% IV SOLR
INTRAVENOUS | Status: AC
  Filled 2015-09-06: qty 50

## 2015-09-06 MED ORDER — ACETAMINOPHEN 160 MG/5ML PO SOLN: 325.0000 mg | ORAL | Status: DC | PRN

## 2015-09-06 MED ORDER — PROPOFOL 10 MG/ML IV BOLUS
INTRAVENOUS | Status: DC | PRN
  Administered 2015-09-06: 30 mg via INTRAVENOUS
  Administered 2015-09-06: 40 mg via INTRAVENOUS

## 2015-09-06 MED ORDER — OXYCODONE HCL 5 MG/5ML PO SOLN
5.0000 mg | Freq: Once | ORAL | Status: DC | PRN
  Filled 2015-09-06: qty 5

## 2015-09-06 MED ORDER — PROPOFOL 500 MG/50ML IV EMUL
INTRAVENOUS | Status: DC | PRN
  Administered 2015-09-06: 100 ug/kg/min via INTRAVENOUS

## 2015-09-06 MED ORDER — ONDANSETRON HCL 4 MG/2ML IJ SOLN
INTRAMUSCULAR | Status: DC | PRN
  Administered 2015-09-06 (×2): 2 mg via INTRAVENOUS

## 2015-09-06 MED ORDER — HYDROCODONE-ACETAMINOPHEN 7.5-325 MG PO TABS
1.0000 | ORAL_TABLET | ORAL | Status: DC
  Administered 2015-09-06: 2 via ORAL
  Administered 2015-09-06 – 2015-09-07 (×2): 1 via ORAL
  Administered 2015-09-07: 2 via ORAL
  Filled 2015-09-06 (×4): qty 2

## 2015-09-06 MED ORDER — ASPIRIN EC 325 MG PO TBEC
325.0000 mg | DELAYED_RELEASE_TABLET | Freq: Two times a day (BID) | ORAL | Status: DC
  Administered 2015-09-07: 325 mg via ORAL
  Filled 2015-09-06 (×3): qty 1

## 2015-09-06 MED ORDER — FENTANYL CITRATE (PF) 100 MCG/2ML IJ SOLN
INTRAMUSCULAR | Status: AC
  Filled 2015-09-06: qty 2

## 2015-09-06 MED ORDER — SODIUM CHLORIDE 0.9 % IJ SOLN
INTRAMUSCULAR | Status: DC | PRN
  Administered 2015-09-06: 30 mL

## 2015-09-06 MED ORDER — DIPHENHYDRAMINE HCL 25 MG PO CAPS: 25.0000 mg | ORAL_CAPSULE | Freq: Four times a day (QID) | ORAL | Status: DC | PRN

## 2015-09-06 MED ORDER — ALUM & MAG HYDROXIDE-SIMETH 200-200-20 MG/5ML PO SUSP: 30.0000 mL | ORAL | Status: DC | PRN

## 2015-09-06 MED ORDER — CEFAZOLIN SODIUM-DEXTROSE 2-3 GM-% IV SOLR
2.0000 g | Freq: Four times a day (QID) | INTRAVENOUS | Status: AC
Start: 2015-09-06 — End: 2015-09-07
  Administered 2015-09-06 – 2015-09-07 (×2): 2 g via INTRAVENOUS
  Filled 2015-09-06 (×2): qty 50

## 2015-09-06 MED ORDER — KETOROLAC TROMETHAMINE 30 MG/ML IJ SOLN
INTRAMUSCULAR | Status: DC | PRN
  Administered 2015-09-06: 30 mg

## 2015-09-06 MED ORDER — PHENOL 1.4 % MT LIQD
1.0000 | OROMUCOSAL | Status: DC | PRN
Start: 2015-09-06 — End: 2015-09-07
  Filled 2015-09-06: qty 177

## 2015-09-06 MED ORDER — BUPIVACAINE-EPINEPHRINE (PF) 0.25% -1:200000 IJ SOLN
INTRAMUSCULAR | Status: DC | PRN
  Administered 2015-09-06: 30 mL

## 2015-09-06 MED ORDER — EPHEDRINE SULFATE 50 MG/ML IJ SOLN
INTRAMUSCULAR | Status: DC | PRN
  Administered 2015-09-06: 10 mg via INTRAVENOUS
  Administered 2015-09-06 (×3): 5 mg via INTRAVENOUS

## 2015-09-06 MED ORDER — SODIUM CHLORIDE 0.9 % IV SOLN
INTRAVENOUS | Status: DC
  Administered 2015-09-06: 23:00:00 via INTRAVENOUS
  Filled 2015-09-06 (×3): qty 1000

## 2015-09-06 MED ORDER — POLYETHYLENE GLYCOL 3350 17 G PO PACK
17.0000 g | PACK | Freq: Two times a day (BID) | ORAL | Status: DC
  Administered 2015-09-06 – 2015-09-07 (×2): 17 g via ORAL

## 2015-09-06 MED ORDER — MAGNESIUM CITRATE PO SOLN: 1.0000 | Freq: Once | ORAL | Status: DC | PRN

## 2015-09-06 MED ORDER — MENTHOL 3 MG MT LOZG: 1.0000 | LOZENGE | OROMUCOSAL | Status: DC | PRN

## 2015-09-06 MED ORDER — CEFAZOLIN SODIUM-DEXTROSE 2-3 GM-% IV SOLR
2.0000 g | INTRAVENOUS | Status: AC
  Administered 2015-09-06: 2 g via INTRAVENOUS

## 2015-09-06 MED ORDER — HYDROMORPHONE HCL 1 MG/ML IJ SOLN
INTRAMUSCULAR | Status: AC
  Filled 2015-09-06: qty 1

## 2015-09-06 MED ORDER — 0.9 % SODIUM CHLORIDE (POUR BTL) OPTIME
TOPICAL | Status: DC | PRN
  Administered 2015-09-06: 1000 mL

## 2015-09-06 MED ORDER — BISACODYL 10 MG RE SUPP
10.0000 mg | Freq: Every day | RECTAL | Status: DC | PRN
Start: 2015-09-06 — End: 2015-09-07

## 2015-09-06 MED ORDER — MIDAZOLAM HCL 2 MG/2ML IJ SOLN
INTRAMUSCULAR | Status: AC
  Filled 2015-09-06: qty 2

## 2015-09-06 MED ORDER — HYDROMORPHONE HCL 1 MG/ML IJ SOLN: 0.5000 mg | INTRAMUSCULAR | Status: DC | PRN

## 2015-09-06 MED ORDER — DEXAMETHASONE SODIUM PHOSPHATE 10 MG/ML IJ SOLN
10.0000 mg | Freq: Once | INTRAMUSCULAR | Status: AC
  Administered 2015-09-07: 10 mg via INTRAVENOUS

## 2015-09-06 MED ORDER — PHENYLEPHRINE HCL 10 MG/ML IJ SOLN
INTRAMUSCULAR | Status: DC | PRN
  Administered 2015-09-06 (×7): 80 ug via INTRAVENOUS
  Administered 2015-09-06: 40 ug via INTRAVENOUS
  Administered 2015-09-06 (×2): 80 ug via INTRAVENOUS

## 2015-09-06 MED ORDER — CHLORTHALIDONE 25 MG PO TABS
25.0000 mg | ORAL_TABLET | Freq: Every day | ORAL | Status: DC
  Administered 2015-09-07: 25 mg via ORAL
  Filled 2015-09-06: qty 1

## 2015-09-06 MED ORDER — CHLORHEXIDINE GLUCONATE 4 % EX LIQD: 60.0000 mL | Freq: Once | CUTANEOUS | Status: DC

## 2015-09-06 MED ORDER — ONDANSETRON HCL 4 MG/2ML IJ SOLN: 4.0000 mg | Freq: Four times a day (QID) | INTRAMUSCULAR | Status: DC | PRN

## 2015-09-06 MED ORDER — FENTANYL CITRATE (PF) 100 MCG/2ML IJ SOLN
INTRAMUSCULAR | Status: DC | PRN
  Administered 2015-09-06 (×2): 50 ug via INTRAVENOUS

## 2015-09-06 SURGICAL SUPPLY — 45 items
BAG DECANTER FOR FLEXI CONT (MISCELLANEOUS) IMPLANT
BAG ZIPLOCK 12X15 (MISCELLANEOUS) IMPLANT
BANDAGE ACE 6X5 VEL STRL LF (GAUZE/BANDAGES/DRESSINGS) ×3 IMPLANT
BLADE SAW SGTL 13.0X1.19X90.0M (BLADE) ×3 IMPLANT
BOWL SMART MIX CTS (DISPOSABLE) ×3 IMPLANT
CAPT KNEE TOTAL 3 ATTUNE ×3 IMPLANT
CEMENT HV SMART SET (Cement) ×6 IMPLANT
CLOTH BEACON ORANGE TIMEOUT ST (SAFETY) ×3 IMPLANT
CUFF TOURN SGL QUICK 34 (TOURNIQUET CUFF) ×2
CUFF TRNQT CYL 34X4X40X1 (TOURNIQUET CUFF) ×1 IMPLANT
DECANTER SPIKE VIAL GLASS SM (MISCELLANEOUS) ×3 IMPLANT
DRAPE U-SHAPE 47X51 STRL (DRAPES) ×3 IMPLANT
DRSG AQUACEL AG ADV 3.5X10 (GAUZE/BANDAGES/DRESSINGS) ×3 IMPLANT
DURAPREP 26ML APPLICATOR (WOUND CARE) ×6 IMPLANT
ELECT REM PT RETURN 9FT ADLT (ELECTROSURGICAL) ×3
ELECTRODE REM PT RTRN 9FT ADLT (ELECTROSURGICAL) ×1 IMPLANT
GLOVE BIOGEL M 7.0 STRL (GLOVE) IMPLANT
GLOVE BIOGEL PI IND STRL 7.5 (GLOVE) ×3 IMPLANT
GLOVE BIOGEL PI IND STRL 8.5 (GLOVE) ×3 IMPLANT
GLOVE BIOGEL PI INDICATOR 7.5 (GLOVE) ×6
GLOVE BIOGEL PI INDICATOR 8.5 (GLOVE) ×6
GLOVE ECLIPSE 8.0 STRL XLNG CF (GLOVE) ×3 IMPLANT
GLOVE ORTHO TXT STRL SZ7.5 (GLOVE) ×6 IMPLANT
GOWN STRL REUS W/TWL LRG LVL3 (GOWN DISPOSABLE) ×6 IMPLANT
GOWN STRL REUS W/TWL XL LVL3 (GOWN DISPOSABLE) ×6 IMPLANT
HANDPIECE INTERPULSE COAX TIP (DISPOSABLE) ×2
LIQUID BAND (GAUZE/BANDAGES/DRESSINGS) ×3 IMPLANT
MANIFOLD NEPTUNE II (INSTRUMENTS) ×3 IMPLANT
PACK TOTAL KNEE CUSTOM (KITS) ×3 IMPLANT
POSITIONER SURGICAL ARM (MISCELLANEOUS) ×3 IMPLANT
SET HNDPC FAN SPRY TIP SCT (DISPOSABLE) ×1 IMPLANT
SET PAD KNEE POSITIONER (MISCELLANEOUS) ×3 IMPLANT
SUCTION FRAZIER HANDLE 12FR (TUBING) ×2
SUCTION TUBE FRAZIER 12FR DISP (TUBING) ×1 IMPLANT
SUT MNCRL AB 4-0 PS2 18 (SUTURE) ×3 IMPLANT
SUT VIC AB 1 CT1 36 (SUTURE) ×3 IMPLANT
SUT VIC AB 2-0 CT1 27 (SUTURE) ×6
SUT VIC AB 2-0 CT1 TAPERPNT 27 (SUTURE) ×3 IMPLANT
SUT VLOC 180 0 24IN GS25 (SUTURE) ×3 IMPLANT
SYR 50ML LL SCALE MARK (SYRINGE) IMPLANT
TRAY FOLEY W/METER SILVER 14FR (SET/KITS/TRAYS/PACK) ×3 IMPLANT
TRAY FOLEY W/METER SILVER 16FR (SET/KITS/TRAYS/PACK) IMPLANT
WATER STERILE IRR 1500ML POUR (IV SOLUTION) ×3 IMPLANT
WRAP KNEE MAXI GEL POST OP (GAUZE/BANDAGES/DRESSINGS) ×3 IMPLANT
YANKAUER SUCT BULB TIP 10FT TU (MISCELLANEOUS) ×3 IMPLANT

## 2015-09-06 NOTE — Discharge Instructions (Signed)

## 2015-09-06 NOTE — Anesthesia Procedure Notes (Signed)
Spinal Patient location during procedure: OR Start time: 09/06/2015 1:04 PM End time: 09/06/2015 1:05 PM Reason for block: at surgeon's request Staffing Resident/CRNA: Freddie Breech Performed by: resident/CRNA  Preanesthetic Checklist Completed: patient identified, site marked, surgical consent, pre-op evaluation, timeout performed, IV checked, risks and benefits discussed, monitors and equipment checked and at surgeon's request Spinal Block Patient position: sitting Prep: ChloraPrep Patient monitoring: heart rate, continuous pulse ox and blood pressure Approach: midline Location: L3-4 Injection technique: single-shot Needle Needle type: Sprotte  Needle gauge: 24 G Needle length: 10 cm Assessment Sensory level: T6 Additional Notes Expiration date of kit checked and confirmed. Patient tolerated procedure well, without complications. X 1 attempt with noted clear CSF return. Loss of motor and sensory on exam post injection.

## 2015-09-06 NOTE — Transfer of Care (Signed)
Immediate Anesthesia Transfer of Care Note  Patient: Mary Garner  Procedure(s) Performed: Procedure(s): LEFT TOTAL KNEE ARTHROPLASTY (Left)  Patient Location: PACU  Anesthesia Type:Regional  Level of Consciousness: awake, alert  and oriented  Airway & Oxygen Therapy: Patient Spontanous Breathing and Patient connected to face mask oxygen  Post-op Assessment: Report given to RN and Post -op Vital signs reviewed and stable  Post vital signs: Reviewed and stable  Last Vitals:  Filed Vitals:   09/06/15 0920  BP: 142/79  Pulse: 75  Temp: 36.6 C  Resp: 16    Complications: No apparent anesthesia complications

## 2015-09-06 NOTE — Anesthesia Preprocedure Evaluation (Signed)
Anesthesia Evaluation  Patient identified by MRN, date of birth, ID band Patient awake    Reviewed: Allergy & Precautions, NPO status , Patient's Chart, lab work & pertinent test results  History of Anesthesia Complications Negative for: history of anesthetic complications  Airway Mallampati: III  TM Distance: >3 FB Neck ROM: Full    Dental  (+) Teeth Intact   Pulmonary neg shortness of breath, sleep apnea and Continuous Positive Airway Pressure Ventilation , neg COPD, neg recent URI, former smoker,    breath sounds clear to auscultation       Cardiovascular hypertension, Pt. on medications (-) angina(-) Past MI and (-) CHF  Rhythm:Regular     Neuro/Psych PSYCHIATRIC DISORDERS Anxiety negative neurological ROS     GI/Hepatic Neg liver ROS, GERD  Medicated and Controlled,  Endo/Other  negative endocrine ROS  Renal/GU negative Renal ROS     Musculoskeletal  (+) Arthritis ,   Abdominal   Peds  Hematology negative hematology ROS (+)   Anesthesia Other Findings   Reproductive/Obstetrics                             Anesthesia Physical Anesthesia Plan  ASA: II  Anesthesia Plan: Spinal   Post-op Pain Management:    Induction: Intravenous  Airway Management Planned: Natural Airway, Nasal Cannula and Simple Face Mask  Additional Equipment:   Intra-op Plan:   Post-operative Plan:   Informed Consent: I have reviewed the patients History and Physical, chart, labs and discussed the procedure including the risks, benefits and alternatives for the proposed anesthesia with the patient or authorized representative who has indicated his/her understanding and acceptance.   Dental advisory given  Plan Discussed with: CRNA and Surgeon  Anesthesia Plan Comments:         Anesthesia Quick Evaluation

## 2015-09-06 NOTE — Interval H&P Note (Signed)
History and Physical Interval Note:  09/06/2015 12:03 PM  Mary Garner  has presented today for surgery, with the diagnosis of LEFT KNEE OA  The various methods of treatment have been discussed with the patient and family. After consideration of risks, benefits and other options for treatment, the patient has consented to  Procedure(s): LEFT TOTAL KNEE ARTHROPLASTY (Left) as a surgical intervention .  The patient's history has been reviewed, patient examined, no change in status, stable for surgery.  I have reviewed the patient's chart and labs.  Questions were answered to the patient's satisfaction.     [name]

## 2015-09-06 NOTE — Op Note (Signed)
NAME:  Mary Garner RECORD NO.:  [id number]                             FACILITY:  [facility]      PHYSICIAN:  Pietro Cassis. Alvan Dame, M.D.  DATE OF BIRTH:  1951/04/20      DATE OF PROCEDURE:  09/06/2015                                     OPERATIVE REPORT         PREOPERATIVE DIAGNOSIS:  Left knee osteoarthritis.      POSTOPERATIVE DIAGNOSIS:  Left knee osteoarthritis.      FINDINGS:  The patient was noted to have complete loss of cartilage and   bone-on-bone arthritis with associated osteophytes in all three compartments of   the knee with a significant synovitis and associated effusion.      PROCEDURE:  Left total knee replacement.      COMPONENTS USED:  DePuy Attune rotating platform posterior stabilized knee   system, a size 7 femur, 6 tibia, 7 mm PS AOX insert, and 38 anatomic patellar   button.      SURGEON:  Pietro Cassis. Alvan Dame, M.D.      ASSISTANT:  Danae Orleans, PA-C.      ANESTHESIA:  Spinal.      SPECIMENS:  None.      COMPLICATION:  None.      DRAINS:  None  EBL: <50cc      TOURNIQUET TIME:   Total Tourniquet Time Documented: Thigh (Left) - 31 minutes Total: Thigh (Left) - 31 minutes  .      The patient was stable to the recovery room.      INDICATION FOR PROCEDURE:  [name] is a 65 y.o. female patient of   mine.  The patient had been seen, evaluated, and treated conservatively in the   office with medication, activity modification, and injections.  The patient had   radiographic changes of bone-on-bone arthritis with endplate sclerosis and osteophytes noted.      The patient failed conservative measures including medication, injections, and activity modification, and at this point was ready for more definitive measures.   Based on the radiographic changes and failed conservative measures, the patient   decided to proceed with total knee replacement.  Risks of infection,   DVT, component failure, need for revision surgery, postop  course, and   expectations were all   discussed and reviewed.  Consent was obtained for benefit of pain   relief.      PROCEDURE IN DETAIL:  The patient was brought to the operative theater.   Once adequate anesthesia, preoperative antibiotics, 2 gm of Ancef, 1 gm of Tranexamic Acid, and 10 mg of Decadron administered, the patient was positioned supine with the left thigh tourniquet placed.  The  left lower extremity was prepped and draped in sterile fashion.  A time-   out was performed identifying the patient, planned procedure, and   extremity.      The left lower extremity was placed in the [facility] leg holder.  The leg was   exsanguinated, tourniquet elevated to 250 mmHg.  A midline incision was   made followed by median  parapatellar arthrotomy.  Following initial   exposure, attention was first directed to the patella.  Precut   measurement was noted to be 24 mm.  I resected down to 14 mm and used a   38 anatomic patellar button to restore patellar height as well as cover the cut   surface.      The lug holes were drilled and a metal shim was placed to protect the   patella from retractors and saw blades.      At this point, attention was now directed to the femur.  The femoral   canal was opened with a drill, irrigated to try to prevent fat emboli.  An   intramedullary rod was passed at 3 degrees valgus, 9 mm of bone was   resected off the distal femur.  Following this resection, the tibia was   subluxated anteriorly.  Using the extramedullary guide, 2 mm of bone was resected off   the proximal medial tibia.  We confirmed the gap would be   stable medially and laterally with a size 6 mm insert as well as confirmed   the cut was perpendicular in the coronal plane, checking with an alignment rod.      Once this was done, I sized the femur to be a size 7 in the anterior-   posterior dimension, chose a standard component based on medial and   lateral dimension.  The size 7 rotation  block was then pinned in   position anterior referenced using the C-clamp to set rotation.  The   anterior, posterior, and  chamfer cuts were made without difficulty nor   notching making certain that I was along the anterior cortex to help   with flexion gap stability.      The final box cut was made off the lateral aspect of distal femur.      At this point, the tibia was sized to be a size 6, the size 6 tray was   then pinned in position through the medial third of the tubercle,   drilled, and keel punched.  Trial reduction was now carried with a 7 femur,  6 tibia, a size 7 mm PS trial insert, and the 38 patella botton.  The knee was brought to   extension, full extension with good flexion stability with the patella   tracking through the trochlea without application of pressure.  Given   all these findings the femoral lugs were drilled and then the trial components removed.  Final components were   opened and cement was mixed.  The knee was irrigated with normal saline   solution and pulse lavage.  The synovial lining was   then injected with 30 cc of 0.25% Marcaine with epinephrine and 1 cc of Toradol plus 30 cc of NS for a   total of 61 cc.      The knee was irrigated.  Final implants were then cemented onto clean and   dried cut surfaces of bone with the knee brought to extension with a size 36mm trial insert.      Once the cement had fully cured, the excess cement was removed   throughout the knee.  I confirmed I was satisfied with the range of   motion and stability, and the final size 7 mm PS AOX insert was chosen.  It was   placed into the knee.      The tourniquet had been let down at 31 minutes.  No significant  hemostasis required.  The   extensor mechanism was then reapproximated using #1 Vicryl and #0 V-lock sutures with the knee   in flexion.  The   remaining wound was closed with 2-0 Vicryl and running 4-0 Monocryl.   The knee was cleaned, dried, dressed sterilely  using Dermabond and   Aquacel dressing.  The patient was then   brought to recovery room in stable condition, tolerating the procedure   well.   Please note that Physician Assistant, Danae Orleans, PA-C, was present for the entirety of the case, and was utilized for pre-operative positioning, peri-operative retractor management, general facilitation of the procedure.  He was also utilized for primary wound closure at the end of the case.              Pietro Cassis Alvan Dame, M.D.    09/06/2015 2:49 PM

## 2015-09-07 ENCOUNTER — Encounter (HOSPITAL_COMMUNITY): Payer: Self-pay | Admitting: Orthopedic Surgery

## 2015-09-07 LAB — CBC
HCT: 31.7 % — ABNORMAL LOW (ref 36.0–46.0)
Hemoglobin: 10.5 g/dL — ABNORMAL LOW (ref 12.0–15.0)
MCH: 29.7 pg (ref 26.0–34.0)
MCHC: 33.1 g/dL (ref 30.0–36.0)
MCV: 89.8 fL (ref 78.0–100.0)
PLATELETS: 235 10*3/uL (ref 150–400)
RBC: 3.53 MIL/uL — ABNORMAL LOW (ref 3.87–5.11)
RDW: 13.2 % (ref 11.5–15.5)
WBC: 12.3 10*3/uL — AB (ref 4.0–10.5)

## 2015-09-07 LAB — BASIC METABOLIC PANEL
ANION GAP: 7 (ref 5–15)
BUN: 18 mg/dL (ref 6–20)
CALCIUM: 8.5 mg/dL — AB (ref 8.9–10.3)
CO2: 27 mmol/L (ref 22–32)
Chloride: 104 mmol/L (ref 101–111)
Creatinine, Ser: 0.75 mg/dL (ref 0.44–1.00)
GFR calc Af Amer: >60 mL/min (ref >60)
GLUCOSE: 141 mg/dL — AB (ref 65–99)
Potassium: 4.4 mmol/L (ref 3.5–5.1)
Sodium: 138 mmol/L (ref 135–145)

## 2015-09-07 MED ORDER — POLYETHYLENE GLYCOL 3350 17 G PO PACK: 17.0000 g | PACK | Freq: Two times a day (BID) | ORAL | Status: DC

## 2015-09-07 MED ORDER — DOCUSATE SODIUM 100 MG PO CAPS: 100.0000 mg | ORAL_CAPSULE | Freq: Two times a day (BID) | ORAL | Status: DC

## 2015-09-07 MED ORDER — METHOCARBAMOL 500 MG PO TABS: 500.0000 mg | ORAL_TABLET | Freq: Four times a day (QID) | ORAL | Status: DC | PRN

## 2015-09-07 MED ORDER — HYDROCODONE-ACETAMINOPHEN 7.5-325 MG PO TABS: 1.0000 | ORAL_TABLET | ORAL | Status: DC | PRN

## 2015-09-07 MED ORDER — ASPIRIN 325 MG PO TBEC: 325.0000 mg | DELAYED_RELEASE_TABLET | Freq: Two times a day (BID) | ORAL | Status: AC

## 2015-09-07 MED ORDER — FERROUS SULFATE 325 (65 FE) MG PO TABS: 325.0000 mg | ORAL_TABLET | Freq: Three times a day (TID) | ORAL | Status: DC

## 2015-09-07 NOTE — Progress Notes (Addendum)
Physical Therapy Treatment Patient Details Name: Mary Garner MRN: [id number] DOB: 1950/12/19 Today's Date: 09/07/2015    History of Present Illness 65 yo female s/p L TKA 09/06/15.     PT Comments    Progressing with mobility. Reviewed technique to get up threshold step-pt able to verbalize "up with good, down with bad". Okay to d/c from PT standpoint.   Follow Up Recommendations  Home health PT     Equipment Recommendations  None recommended by PT    Recommendations for Other Services       Precautions / Restrictions Precautions Precautions: None Restrictions Weight Bearing Restrictions: No LLE Weight Bearing: Weight bearing as tolerated    Mobility  Bed Mobility Overal bed mobility: Needs Assistance Bed Mobility: Sit to Supine     Supine to sit: Min guard Sit to supine: Min guard   General bed mobility comments: close guard for safety.   Transfers Overall transfer level: Needs assistance Equipment used: Rolling walker (2 wheeled) Transfers: Sit to/from Stand Sit to Stand: Min guard         General transfer comment: close guard for safety. VCs hand placement  Ambulation/Gait Ambulation/Gait assistance: Min guard Ambulation Distance (Feet): 135 Feet Assistive device: Rolling walker (2 wheeled) Gait Pattern/deviations: Step-to pattern;Antalgic     General Gait Details: close guard for safety.    Stairs            Wheelchair Mobility    Modified Rankin (Stroke Patients Only)       Balance                                    Cognition Arousal/Alertness: Awake/alert Behavior During Therapy: WFL for tasks assessed/performed Overall Cognitive Status: Within Functional Limits for tasks assessed                      Exercises    General Comments        Pertinent Vitals/Pain Pain Assessment: 0-10 Pain Score: 3  Pain Location: L knee Pain Descriptors / Indicators: Sore Pain Intervention(s): Monitored during  session;Ice applied;Repositioned    Home Living Family/patient expects to be discharged to:: Private residence Living Arrangements: Alone Available Help at Discharge: Family (brother for 2 weeks) Type of Home: House Home Access: Level entry   Home Layout: One level Home Equipment: Environmental consultant - 2 wheels;Cane - single point;Adaptive equipment;Bedside commode      Prior Function Level of Independence: Independent          PT Goals (current goals can now be found in the care plan section) Acute Rehab PT Goals Patient Stated Goal: home PT Goal Formulation: With patient Time For Goal Achievement: 09/14/15 Potential to Achieve Goals: Good Progress towards PT goals: Progressing toward goals    Frequency  7X/week    PT Plan Current plan remains appropriate    Co-evaluation             End of Session Equipment Utilized During Treatment: Gait belt Activity Tolerance: Patient tolerated treatment well Patient left: in bed;with call bell/phone within reach     Time: 1110-1119 PT Time Calculation (min) (ACUTE ONLY): 9 min  Charges:  $Gait Training: 8-22 mins                    G Codes:      Mary Garner, MPT Pager: [phone]

## 2015-09-07 NOTE — Evaluation (Signed)
Physical Therapy Evaluation Patient Details Name: Mary Garner MRN: [id number] DOB: 11-14-1950 Today's Date: 09/07/2015   History of Present Illness  65 yo female s/p L TKA 09/06/15.   Clinical Impression  On eval, pt was Min guard assist for mobility-walked ~150 feet with RW. Pain rated 1/10. Will plan to have a 2nd session prior to d/c later today.     Follow Up Recommendations Home health PT    Equipment Recommendations  None recommended by PT    Recommendations for Other Services       Precautions / Restrictions Precautions Precautions: None Restrictions Weight Bearing Restrictions: No LLE Weight Bearing: Weight bearing as tolerated      Mobility  Bed Mobility Overal bed mobility: Needs Assistance Bed Mobility: Supine to Sit     Supine to sit: Min guard     General bed mobility comments: close guard for safety.   Transfers Overall transfer level: Needs assistance Equipment used: Rolling walker (2 wheeled) Transfers: Sit to/from Stand Sit to Stand: Min guard         General transfer comment: close guard for safety. VCs hand placement  Ambulation/Gait Ambulation/Gait assistance: Min guard Ambulation Distance (Feet): 150 Feet Assistive device: Rolling walker (2 wheeled) Gait Pattern/deviations: Step-to pattern;Step-through pattern     General Gait Details: close guard for safety.   Stairs            Wheelchair Mobility    Modified Rankin (Stroke Patients Only)       Balance                                             Pertinent Vitals/Pain      Home Living Family/patient expects to be discharged to:: Private residence Living Arrangements: Alone Available Help at Discharge: Family (brother for 2 weeks) Type of Home: House Home Access: Level entry     Home Layout: One [place] [employer]: Environmental consultant - 2 wheels;Cane - single point;Adaptive equipment;Bedside commode      Prior Function Level of Independence:  Independent               Hand Dominance        Extremity/Trunk Assessment   Upper Extremity Assessment: Overall WFL for tasks assessed           Lower Extremity Assessment: LLE deficits/detail   LLE Deficits / Details: hip flex 3-/5, moves ankle well.   Cervical / Trunk Assessment: Normal  Communication   Communication: No difficulties  Cognition Arousal/Alertness: Awake/alert Behavior During Therapy: WFL for tasks assessed/performed Overall Cognitive Status: Within Functional Limits for tasks assessed                      General Comments      Exercises Total Joint Exercises Ankle Circles/Pumps: AROM;Both;10 reps;Supine Quad Sets: AROM;Both;10 reps;Supine Heel Slides: AAROM;Left;10 reps;Supine Hip ABduction/ADduction: Left;10 reps;Supine;AROM;AAROM Straight Leg Raises: AROM;AAROM;Left;10 reps;Supine Goniometric ROM: ~10-55 degrees      Assessment/Plan    PT Assessment Patient needs continued PT services  PT Diagnosis Difficulty walking;Acute pain   PT Problem List Decreased strength;Decreased range of motion;Decreased activity tolerance;Decreased balance;Decreased mobility;Decreased knowledge of use of DME;Pain  PT Treatment Interventions DME instruction;Gait training;Stair training;Functional mobility training;Therapeutic activities;Balance training;Therapeutic exercise;Patient/family education   PT Goals (Current goals can be found in the Care Plan section) Acute Rehab PT Goals Patient Stated Goal:  home PT Goal Formulation: With patient Time For Goal Achievement: 09/14/15 Potential to Achieve Goals: Good    Frequency 7X/week   Barriers to discharge        Co-evaluation               End of Session Equipment Utilized During Treatment: Gait belt Activity Tolerance: Patient tolerated treatment well Patient left: in chair;with call bell/phone within reach           Time: 0821-0850 PT Time Calculation (min) (ACUTE ONLY): 29  min   Charges:   PT Evaluation $PT Eval Low Complexity: 1 Procedure PT Treatments $Gait Training: 8-22 mins   PT G Codes:        Weston Anna, MPT Pager: [phone]

## 2015-09-07 NOTE — Care Management Note (Addendum)
Case Management Note  Patient Details  Name: TELLY BROBERG MRN: [id number] Date of Birth: 08-21-1950  Subjective/Objective:                  left total knee arthroplasty Action/Plan: Discharge planning Expected Discharge Date:  09/07/15               Expected Discharge Plan:  [place]  In-House Referral:     Discharge planning Services  CM Consult  Post Acute Care Choice:  Home Health Choice offered to:  Patient  DME Arranged:  N/A DME Agency:  NA  HH Arranged:  PT HH Agency:  [facility]  Status of Service:  Completed, signed off  Medicare Important Message Given:    Date Medicare IM Given:    Medicare IM give by:    Date Additional Medicare IM Given:    Additional Medicare Important Message give by:     If discussed at [place] of Stay Meetings, dates discussed:    Additional Comments: CM met with pt in room to offer choice and unfortunately her choice of [facility] will not accept her insurance.  [facility] will accept her insurance and [facility] will render HHPT.  Pt states she has both rolling walker and 3n1. Cm called [facility] and per [facility] request has faxed the facesheet, faces to face, orders, H&P, OP note, and PT EVAL note to [phone].  CM received confirmation of receipt.  NO other CM needs were communicated. Dellie Catholic, RN 09/07/2015, 12:37 PM

## 2015-09-07 NOTE — Progress Notes (Signed)
Pt d/c home with [facility] for PT. No DME needs. Perscriptions given and explained to patient. Pain was assessed and pain medicine offered to patient prior to d/c, which she declined. AVS reviewed and "My Chart" discussed with pt. Pt capable of verbalizing medications, signs and symptoms of infection, and follow-up appointments. Remains hemodynamically stable. No signs and symptoms of distress. Educated pt to return to ER in the case of SOB, dizziness, or chest pain.

## 2015-09-07 NOTE — Evaluation (Signed)
Occupational Therapy Evaluation AND Discharge  Patient Details Name: Mary Garner MRN: [id number] DOB: [birth year] Today's Date: 09/07/2015    History of Present Illness 65 yo female s/p L TKA 09/06/15.    Clinical Impression   Patient admitted with above. Patient independent PTA. Patient currently functioning at an overall supervision level. Pt reports her brother will be present for the next 2 weeks to assist prn.  No additional OT needs identified, D/C from acute OT services and no additional follow-up OT needs at this time. All appropriate education provided to patient. Please re-order OT if needed.      Follow Up Recommendations  No OT follow up;Supervision - Intermittent    Equipment Recommendations  None recommended by OT    Recommendations for Other Services  None at this time   Precautions / Restrictions Precautions Precautions: Knee Precaution Comments: reviewed knee precautions and no pillow under knee Restrictions Weight Bearing Restrictions: Yes LLE Weight Bearing: Weight bearing as tolerated    Mobility Bed Mobility Overal bed mobility: Needs Assistance Bed Mobility: Supine to Sit;Sit to Supine     Supine to sit: Supervision Sit to supine: Supervision   General bed mobility comments: supervision for safety   Transfers Overall transfer level: Needs assistance Equipment used: Rolling walker (2 wheeled) Transfers: Sit to/from Stand Sit to Stand: Supervision General transfer comment: supervision for safety    Balance Overall balance assessment: Needs assistance Sitting-balance support: No upper extremity supported;Feet supported Sitting balance-Leahy Scale: Normal     Standing balance support: Bilateral upper extremity supported;During functional activity Standing balance-Leahy Scale: Good    ADL Overall ADL's : Needs assistance/impaired General ADL Comments: Pt overall supervision to mod I with ADLs and functional mobility. Pt refuses using shower  seat in walk-in shower, "I can just stand" and pt adament about this. Pt did practice stepping over shower stall threshold (anterior/posterior technique).     Pertinent Vitals/Pain Pain Assessment: No/denies pain Pain Score: 3  Pain Location: L knee Pain Descriptors / Indicators: Sore Pain Intervention(s): Monitored during session;Ice applied;Repositioned     Hand Dominance Right   Extremity/Trunk Assessment Upper Extremity Assessment Upper Extremity Assessment: Overall WFL for tasks assessed   Lower Extremity Assessment Lower Extremity Assessment: Defer to PT evaluation LLE Deficits / Details: hip flex 3-/5, moves ankle well.    Cervical / Trunk Assessment Cervical / Trunk Assessment: Normal   Communication Communication Communication: No difficulties   Cognition Arousal/Alertness: Awake/alert Behavior During Therapy: WFL for tasks assessed/performed Overall Cognitive Status: Within Functional Limits for tasks assessed              Home Living Family/patient expects to be discharged to:: Private residence Living Arrangements: Alone Available Help at Discharge: Family (brother for 2 weeks) Type of Home: House Home Access: Level entry     Home Layout: One level     Bathroom Shower/Tub: Walk-in shower Home Equipment: Environmental consultant - 2 wheels;Cane - single point;Adaptive equipment;Bedside commode Adaptive Equipment: Reacher        Prior Functioning/Environment Level of Independence: Independent     OT Diagnosis: Generalized weakness;Acute pain   OT Problem List:   N/a, no acute OT needs identified at this time     OT Treatment/Interventions:   N/a, no acute OT needs identified at this time     OT Goals(Current goals can be found in the care plan section) Acute Rehab OT Goals Patient Stated Goal: go home today OT Goal Formulation: All assessment and education complete, DC therapy  OT Frequency:   N/a, no acute OT needs identified at this time     Barriers to  D/C:  none known at this time    End of Session Equipment Utilized During Treatment: Rolling walker  Activity Tolerance: Patient tolerated treatment well Patient left: in bed;with call bell/phone within reach   Time: 1146-1155 OT Time Calculation (min): 9 min Charges:  OT General Charges $OT Visit: 1 Procedure OT Evaluation $OT Eval Low Complexity: 1 Procedure  Chrys Racer , MS, OTR/L, CLT Pager: [phone]  09/07/2015, 12:11 PM

## 2015-09-07 NOTE — Progress Notes (Signed)
Utilization review completed.  

## 2015-09-09 NOTE — Anesthesia Postprocedure Evaluation (Signed)
Anesthesia Post Note  Patient: Mary Garner  Procedure(s) Performed: Procedure(s) (LRB): LEFT TOTAL KNEE ARTHROPLASTY (Left)  Patient location during evaluation: PACU Anesthesia Type: Spinal Level of consciousness: awake Pain management: pain level controlled Vital Signs Assessment: post-procedure vital signs reviewed and stable Respiratory status: spontaneous breathing Cardiovascular status: stable Postop Assessment: spinal receding and no signs of nausea or vomiting Anesthetic complications: no    Last Vitals:  Filed Vitals:   09/07/15 0649 09/07/15 1025  BP:  124/57  Pulse:  64  Temp:  36.7 C  Resp: 16 16    Last Pain:  Filed Vitals:   09/07/15 1026  PainSc: 1                  Anglia Blakley

## 2015-09-13 NOTE — Progress Notes (Signed)
     Subjective: 1 Day Post-Op Procedure(s) (LRB): LEFT TOTAL KNEE ARTHROPLASTY (Left)   Seen by Dr. Alvan Dame. Patient reports pain as mild, pain controlled.  No events throughout the night. Ready to work with PT and start the recovery process.  Ready to be discharged home after PT.  Objective:   VITALS:   Filed Vitals:   09/07/15 0649 09/07/15 1025  BP:  124/57  Pulse:  64  Temp:  98 F (36.7 C)  Resp: 16 16    Dorsiflexion/Plantar flexion intact Incision: dressing C/D/I No cellulitis present Compartment soft  LABS  CBC Latest Ref Rng 09/07/2015  WBC 4.0 - 10.5 K/uL 12.3(H)  Hemoglobin 12.0 - 15.0 g/dL 10.5(L)  Hematocrit 36.0 - 46.0 % 31.7(L)  Platelets 150 - 400 K/uL 235    BMET    Component Value Date/Time   NA 138 09/07/2015 0338   K 4.4 09/07/2015 0338   CL 104 09/07/2015 0338   CO2 27 09/07/2015 0338   GLUCOSE 141* 09/07/2015 0338   BUN 18 09/07/2015 0338   CREATININE 0.75 09/07/2015 0338   CALCIUM 8.5* 09/07/2015 0338   GFRNONAA >60 09/07/2015 0338   GFRAA >60 09/07/2015 0338       Assessment/Plan: 1 Day Post-Op Procedure(s) (LRB): LEFT TOTAL KNEE ARTHROPLASTY (Left) Foley cath d/c'ed Advance diet Up with therapy D/C IV fluids Discharge home with home health Follow up in 2 weeks at [facility]. Follow up with OLIN,Tazia Illescas D in 2 weeks.  Contact information:  [facility] [address] [place] [id number]        Overweight (BMI 25-29.9) Estimated body mass index is 29.3 kg/(m^2) as calculated from the following:   Height as of this encounter: 5\' 11"  (1.803 m).   Weight as of this encounter: 95.255 kg (210 lb). Patient also counseled that weight may inhibit the healing process Patient counseled that losing weight will help with future health issues      West Pugh. Mikaele Stecher   PAC  09/13/2015, 10:39 AM

## 2015-09-13 NOTE — Discharge Summary (Signed)
Physician Discharge Summary  Patient ID: [name] MRN: [id number] DOB/AGE: [birth year] 65 y.o.  Admit date: 09/06/2015 Discharge date: 09/07/2015   Procedures:  Procedure(s) (LRB): LEFT TOTAL KNEE ARTHROPLASTY (Left)  Attending Physician:  Dr. Paralee Cancel   Admission Diagnoses:   Left knee primary OA / pain  Discharge Diagnoses:  Active Problems:   S/P knee replacement  Past Medical History  Diagnosis Date  . Hypertension   . Anxiety   . GERD (gastroesophageal reflux disease)   . Arthritis   . 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 Sleep apnea     cpap- 3.5     HPI:    Mary Garner, 65 y.o. female, has a history of pain and functional disability in the left knee due to arthritis and has failed non-surgical conservative treatments for greater than 12 weeks to include NSAID's and/or analgesics and activity modification. Onset of symptoms was gradual, starting 2+ years ago with gradually worsening course since that time. The patient noted no past surgery on the left knee(s). Patient currently rates pain in the left knee(s) at 9 out of 10 with activity. Patient has worsening of pain with activity and weight bearing, pain that interferes with activities of daily living, pain with passive range of motion, crepitus and joint swelling. Patient has evidence of periarticular osteophytes and joint space narrowing by imaging studies. There is no active infection. Risks, benefits and expectations were discussed with the patient. Risks including but not limited to the risk of anesthesia, blood clots, nerve damage, blood vessel damage, failure of the prosthesis, infection and up to and including death. Patient understand the risks, benefits and expectations and wishes to proceed with surgery.   PCP: [name], NP   Discharged Condition: good  Hospital Course:  Patient underwent the above stated procedure on 09/06/2015. Patient tolerated the procedure well and brought to the  recovery room in good condition and subsequently to the floor.  POD #1 BP: 101/52 ; Pulse: 74 ; Temp: 98 F (36.7 C) ; Resp: 20 Pain controlled well, pain is mild.  No events throughout the night. Looking forward to working with PT and getting better. Ready to be discharged home. Dorsiflexion/plantar flexion intact, incision: dressing C/D/I, no cellulitis present and compartment soft.   LABS  Basename    HGB     10.5  HCT     31.7    Discharge Exam: General appearance: alert, cooperative and no distress Extremities: Homans sign is negative, no sign of DVT, no edema, redness or tenderness in the calves or thighs and no ulcers, gangrene or trophic changes  Disposition: Home with follow up in 2 weeks   Follow-up Information    Follow up with [name], MD. Schedule an appointment as soon as possible for a visit in 2 weeks.   Specialty:  Orthopedic Surgery   Contact information:   [address] [place] [address] [phone]       Follow up with [facility].   Specialty:  Home Health Services   Why:  home health physical therapy as [facility] does not accept your insurance.   Contact information:   [address] [place] [phone]       Discharge Instructions    Call MD / Call 911    Complete by:  As directed   If you experience chest pain or shortness of breath, CALL 911 and be transported  to the hospital emergency room.  If you develope a fever above 101 F, pus (white drainage) or increased drainage or redness at the wound, or calf pain, call your surgeon's office.     Change dressing    Complete by:  As directed   Maintain surgical dressing until follow up in the clinic. If the edges start to pull up, may reinforce with tape. If the dressing is no longer working, may remove and cover with gauze and tape, but must keep the area dry and clean.  Call with any questions or concerns.     Constipation  Prevention    Complete by:  As directed   Drink plenty of fluids.  Prune juice may be helpful.  You may use a stool softener, such as Colace (over the counter) 100 mg twice a day.  Use MiraLax (over the counter) for constipation as needed.     Diet - low sodium heart healthy    Complete by:  As directed      Discharge instructions    Complete by:  As directed   Maintain surgical dressing until follow up in the clinic. If the edges start to pull up, may reinforce with tape. If the dressing is no longer working, may remove and cover with gauze and tape, but must keep the area dry and clean.  Follow up in 2 weeks at [facility]. Call with any questions or concerns.     Increase activity slowly as tolerated    Complete by:  As directed   Weight bearing as tolerated with assist device (walker, cane, etc) as directed, use it as long as suggested by your surgeon or therapist, typically at least 4-6 weeks.     TED hose    Complete by:  As directed   Use stockings (TED hose) for 2 weeks on both leg(s).  You may remove them at night for sleeping.             Medication List    STOP taking these medications        acetaminophen 650 MG CR tablet  Commonly known as:  TYLENOL      TAKE these medications        ALPRAZolam 0.5 MG tablet  Commonly known as:  XANAX  Take 0.5 mg by mouth 2 (two) times daily as needed for anxiety.     aspirin 325 MG EC tablet  Take 1 tablet (325 mg total) by mouth 2 (two) times daily.     chlorthalidone 25 MG tablet  Commonly known as:  HYGROTON  Take 25 mg by mouth daily.     docusate sodium 100 MG capsule  Commonly known as:  COLACE  Take 1 capsule (100 mg total) by mouth 2 (two) times daily.     famotidine 20 MG tablet  Commonly known as:  PEPCID  Take 20 mg by mouth daily as needed for heartburn or indigestion.     ferrous sulfate 325 (65 FE) MG tablet  Take 1 tablet (325 mg total) by mouth 3 (three) times daily after meals.     FISH  OIL PO  Take 1 capsule by mouth daily.     HYDROcodone-acetaminophen 7.5-325 MG tablet  Commonly known as:  NORCO  Take 1-2 tablets by mouth every 4 (four) hours as needed for moderate pain.     lisinopril 30 MG tablet  Commonly known as:  PRINIVIL,ZESTRIL  Take 30 mg by mouth every morning.  methocarbamol 500 MG tablet  Commonly known as:  ROBAXIN  Take 1 tablet (500 mg total) by mouth every 6 (six) hours as needed for muscle spasms.     polyethylene glycol packet  Commonly known as:  MIRALAX / GLYCOLAX  Take 17 g by mouth 2 (two) times daily.     potassium chloride 10 MEQ tablet  Commonly known as:  K-DUR,KLOR-CON  Take 10 mEq by mouth daily.     VITAMIN B-12 PO  Take 1 tablet by mouth daily.     VITAMIN C PO  Take 1 tablet by mouth daily.     VITAMIN D PO  Take 1 tablet by mouth daily.         Signed: West Pugh. Miela Desjardin   PA-C  09/13/2015, 10:32 AM

## 2016-04-04 ENCOUNTER — Encounter (HOSPITAL_COMMUNITY): Admission: RE | Payer: Self-pay | Source: Ambulatory Visit

## 2016-04-04 ENCOUNTER — Inpatient Hospital Stay (HOSPITAL_COMMUNITY): Admission: RE | Admit: 2016-04-04 | Payer: 59 | Source: Ambulatory Visit | Admitting: Orthopedic Surgery

## 2016-04-04 SURGERY — ARTHROPLASTY, HIP, TOTAL, ANTERIOR APPROACH
Anesthesia: Spinal | Site: Hip | Laterality: Right

## 2016-07-19 ENCOUNTER — Encounter (HOSPITAL_COMMUNITY): Payer: 59

## 2016-07-25 ENCOUNTER — Inpatient Hospital Stay: Admit: 2016-07-25 | Payer: 59 | Admitting: Orthopedic Surgery

## 2016-07-25 SURGERY — ARTHROPLASTY, HIP, TOTAL, ANTERIOR APPROACH
Anesthesia: Spinal | Site: Hip | Laterality: Right

## 2016-11-06 NOTE — H&P (Signed)
TOTAL KNEE ADMISSION H&P  Patient is being admitted for right total knee arthroplasty.  Subjective:  Chief Complaint:     Right knee primary OA / pain  HPI: Mary Garner, 66 y.o. female, has a history of pain and functional disability in the right knee due to arthritis and has failed non-surgical conservative treatments for greater than 12 weeks to include NSAID's and/or analgesics, corticosteriod injections and activity modification.  Onset of symptoms was gradual, starting ~1 years ago with gradually worsening course since that time. The patient noted prior procedures on the knee to include  arthroscopy on the right knee(s).  Patient currently rates pain in the right knee(s) at 9 out of 10 with activity. Patient has worsening of pain with activity and weight bearing, pain that interferes with activities of daily living, pain with passive range of motion, crepitus and joint swelling.  Patient has evidence of periarticular osteophytes and joint space narrowing by imaging studies.  There is no active infection.   Risks, benefits and expectations were discussed with the patient.  Risks including but not limited to the risk of anesthesia, blood clots, nerve damage, blood vessel damage, failure of the prosthesis, infection and up to and including death.  Patient understand the risks, benefits and expectations and wishes to proceed with surgery.   PCP: [name], NP  D/C Plans:       Home with HHPT  Post-op Meds:       No Rx given  Tranexamic Acid:      To be given - IV   Decadron:      Is to be given  FYI:     ASA  Norco  CPAP  DME:   Pt already has equipment  PT:   HHPT    Patient Active Problem List   Diagnosis Date Noted  . S/P knee replacement 09/06/2015  . Expected blood loss anemia 09/24/2013  . Obese 09/24/2013  . S/P left THA, AA 09/23/2013   Past Medical History:  Diagnosis Date  . Anxiety   . Arthritis   . 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 GERD  (gastroesophageal reflux disease)   . Hypertension   . Sleep apnea    cpap- 3.5     Past Surgical History:  Procedure Laterality Date  . BASAL CELL CARCINOMA EXCISION  10 or 12 years ago   x3   . TOTAL HIP ARTHROPLASTY Left 09/23/2013   Procedure: LEFT TOTAL HIP ARTHROPLASTY ANTERIOR APPROACH;  Surgeon: [name], MD;  Location: [facility];  Service: Orthopedics;  Laterality: Left;  . TOTAL KNEE ARTHROPLASTY Left 09/06/2015   Procedure: LEFT TOTAL KNEE ARTHROPLASTY;  Surgeon: Paralee Cancel, MD;  Location: [facility];  Service: Orthopedics;  Laterality: Left;  . TUBAL LIGATION  30 years ago    No prescriptions prior to admission.   Allergies  Allergen Reactions  . Hctz [Hydrochlorothiazide]     Dizziness     Social History  Substance Use Topics  . Smoking status: Former Research scientist (life sciences)  . Smokeless tobacco: Never Used  . Alcohol use 0.0 oz/week     Comment: beer or wine on weekend        Review of Systems  Constitutional: Negative.   HENT: Negative.   Eyes: Negative.   Respiratory: Negative.   Cardiovascular: Negative.   Gastrointestinal: Positive for heartburn.  Genitourinary: Negative.   Musculoskeletal: Positive for joint pain.  Skin: Negative.   Neurological: Negative.   Endo/Heme/Allergies: Negative.  Psychiatric/Behavioral: The patient is nervous/anxious.     Objective:  Physical Exam  Constitutional: She is oriented to person, place, and time. She appears well-developed.  HENT:  Head: Normocephalic.  Eyes: Pupils are equal, round, and reactive to light.  Neck: Neck supple. No JVD present. No tracheal deviation present. No thyromegaly present.  Cardiovascular: Normal rate, regular rhythm and intact distal pulses.   Respiratory: Effort normal and breath sounds normal. No respiratory distress. She has no wheezes.  GI: Soft. There is no tenderness. There is no guarding.  Musculoskeletal:       Right knee: She exhibits decreased range of motion, swelling and bony  tenderness. She exhibits no ecchymosis, no deformity, no laceration and no erythema. Tenderness found.  Lymphadenopathy:    She has no cervical adenopathy.  Neurological: She is alert and oriented to person, place, and time.  Skin: Skin is warm and dry.  Psychiatric: She has a normal mood and affect.      Labs:  Estimated body mass index is 29.29 kg/m as calculated from the following:   Height as of 09/06/15: 5\' 11"  (1.803 m).   Weight as of 09/06/15: 95.3 kg (210 lb).   Imaging Review Plain radiographs demonstrate severe degenerative joint disease of the right knee(s).  The bone quality appears to be good for age and reported activity level.  Assessment/Plan:  End stage arthritis, right knee   The patient history, physical examination, clinical judgment of the provider and imaging studies are consistent with end stage degenerative joint disease of the right knee(s) and total knee arthroplasty is deemed medically necessary. The treatment options including medical management, injection therapy arthroscopy and arthroplasty were discussed at length. The risks and benefits of total knee arthroplasty were presented and reviewed. The risks due to aseptic loosening, infection, stiffness, patella tracking problems, thromboembolic complications and other imponderables were discussed. The patient acknowledged the explanation, agreed to proceed with the plan and consent was signed. Patient is being admitted for inpatient treatment for surgery, pain control, PT, OT, prophylactic antibiotics, VTE prophylaxis, progressive ambulation and ADL's and discharge planning. The patient is planning to be discharged home with home health services.      West Pugh Carols Clemence   PA-C  11/06/2016, 4:05 PM

## 2016-11-14 NOTE — Patient Instructions (Signed)
Mary Garner  11/14/2016   Your procedure is scheduled on:   Report to [facility] Main  Entrance              Report to admitting at      [place] AM   Call this number if you have problems the morning of surgery  [phone]   Remember: ONLY 1 PERSON MAY GO WITH YOU TO SHORT STAY TO GET  READY MORNING OF [place].  Do not eat food or drink liquids :After Midnight.     Take these medicines the morning of surgery with A SIP OF WATER: FAMOTIDINE                                You may not have any metal on your body including hair pins and              piercings  Do not wear jewelry, make-up, lotions, powders or perfumes, deodorant             Do not wear nail polish.  Do not shave  48 hours prior to surgery.              Do not bring valuables to the hospital. [place].  Contacts, dentures or bridgework may not be worn into surgery.  Leave suitcase in the car. After surgery it may be brought to your room              Please read over the following fact sheets you were given: _____________________________________________________________________           [facility] - Preparing for Surgery Before surgery, you can play an important role.  Because skin is not sterile, your skin needs to be as free of germs as possible.  You can reduce the number of germs on your skin by washing with CHG (chlorahexidine gluconate) soap before surgery.  CHG is an antiseptic cleaner which kills germs and bonds with the skin to continue killing germs even after washing. Please DO NOT use if you have an allergy to CHG or antibacterial soaps.  If your skin becomes reddened/irritated stop using the CHG and inform your nurse when you arrive at Short Stay. Do not shave (including legs and underarms) for at least 48 hours prior to the first CHG shower.  You may shave your face/neck. Please follow these instructions carefully:  1.   Shower with CHG Soap the night before surgery and the  morning of Surgery.  2.  If you choose to wash your hair, wash your hair first as usual with your  normal  shampoo.  3.  After you shampoo, rinse your hair and body thoroughly to remove the  shampoo.                           4.  Use CHG as you would any other liquid soap.  You can apply chg directly  to the skin and wash                       Gently with a scrungie or clean washcloth.  5.  Apply the CHG Soap to your  body ONLY FROM THE NECK DOWN.   Do not use on face/ open                           Wound or open sores. Avoid contact with eyes, ears mouth and genitals (private parts).                       Wash face,  Genitals (private parts) with your normal soap.             6.  Wash thoroughly, paying special attention to the area where your surgery  will be performed.  7.  Thoroughly rinse your body with warm water from the neck down.  8.  DO NOT shower/wash with your normal soap after using and rinsing off  the CHG Soap.                9.  Pat yourself dry with a clean towel.            10.  Wear clean pajamas.            11.  Place clean sheets on your bed the night of your first shower and do not  sleep with pets. Day of Surgery : Do not apply any lotions/deodorants the morning of surgery.  Please wear clean clothes to the hospital/surgery center.  FAILURE TO FOLLOW THESE INSTRUCTIONS MAY RESULT IN THE CANCELLATION OF YOUR SURGERY PATIENT SIGNATURE_________________________________  NURSE SIGNATURE__________________________________  ________________________________________________________________________  WHAT IS A BLOOD TRANSFUSION? Blood Transfusion Information  A transfusion is the replacement of blood or some of its parts. Blood is made up of multiple cells which provide different functions.  Red blood cells carry oxygen and are used for blood loss replacement.  White blood cells fight against infection.  Platelets control  bleeding.  Plasma helps clot blood.  Other blood products are available for specialized needs, such as hemophilia or other clotting disorders. BEFORE THE TRANSFUSION  Who gives blood for transfusions?   Healthy volunteers who are fully evaluated to make sure their blood is safe. This is blood bank blood. Transfusion therapy is the safest it has ever been in the practice of medicine. Before blood is taken from a donor, a complete history is taken to make sure that person has no history of diseases nor engages in risky social behavior (examples are intravenous drug use or sexual activity with multiple partners). The donor's travel history is screened to minimize risk of transmitting infections, such as malaria. The donated blood is tested for signs of infectious diseases, such as HIV and hepatitis. The blood is then tested to be sure it is compatible with you in order to minimize the chance of a transfusion reaction. If you or a relative donates blood, this is often done in anticipation of surgery and is not appropriate for emergency situations. It takes many days to process the donated blood. RISKS AND COMPLICATIONS Although transfusion therapy is very safe and saves many lives, the main dangers of transfusion include:   Getting an infectious disease.  Developing a transfusion reaction. This is an allergic reaction to something in the blood you were given. Every precaution is taken to prevent this. The decision to have a blood transfusion has been considered carefully by your caregiver before blood is given. Blood is not given unless the benefits outweigh the risks. AFTER THE TRANSFUSION  Right after receiving a blood transfusion, you will usually feel  much better and more energetic. This is especially true if your red blood cells have gotten low (anemic). The transfusion raises the level of the red blood cells which carry oxygen, and this usually causes an energy increase.  The nurse  administering the transfusion will monitor you carefully for complications. HOME CARE INSTRUCTIONS  No special instructions are needed after a transfusion. You may find your energy is better. Speak with your caregiver about any limitations on activity for underlying diseases you may have. SEEK MEDICAL CARE IF:   Your condition is not improving after your transfusion.  You develop redness or irritation at the intravenous (IV) site. SEEK IMMEDIATE MEDICAL CARE IF:  Any of the following symptoms occur over the next 12 hours:  Shaking chills.  You have a temperature by mouth above 102 F (38.9 C), not controlled by medicine.  Chest, back, or muscle pain.  People around you feel you are not acting correctly or are confused.  Shortness of breath or difficulty breathing.  Dizziness and fainting.  You get a rash or develop hives.  You have a decrease in urine output.  Your urine turns a dark color or changes to pink, red, or brown. Any of the following symptoms occur over the next 10 days:  You have a temperature by mouth above 102 F (38.9 C), not controlled by medicine.  Shortness of breath.  Weakness after normal activity.  The white part of the eye turns yellow (jaundice).  You have a decrease in the amount of urine or are urinating less often.  Your urine turns a dark color or changes to pink, red, or brown. Document Released: 06/23/2000 Document Revised: 09/18/2011 Document Reviewed: 02/10/2008 ExitCare Patient Information 2014 [place].  _______________________________________________________________________  Incentive Spirometer  An incentive spirometer is a tool that can help keep your lungs clear and active. This tool measures how well you are filling your lungs with each breath. Taking long deep breaths may help reverse or decrease the chance of developing breathing (pulmonary) problems (especially infection) following:  A long period of time when you are  unable to move or be active. BEFORE THE PROCEDURE   If the spirometer includes an indicator to show your best effort, your nurse or respiratory therapist will set it to a desired goal.  If possible, sit up straight or lean slightly forward. Try not to slouch.  Hold the incentive spirometer in an upright position. INSTRUCTIONS FOR USE  1. Sit on the edge of your bed if possible, or sit up as far as you can in bed or on a chair. 2. Hold the incentive spirometer in an upright position. 3. Breathe out normally. 4. Place the mouthpiece in your mouth and seal your lips tightly around it. 5. Breathe in slowly and as deeply as possible, raising the piston or the ball toward the top of the column. 6. Hold your breath for 3-5 seconds or for as long as possible. Allow the piston or ball to fall to the bottom of the column. 7. Remove the mouthpiece from your mouth and breathe out normally. 8. Rest for a few seconds and repeat Steps 1 through 7 at least 10 times every 1-2 hours when you are awake. Take your time and take a few normal breaths between deep breaths. 9. The spirometer may include an indicator to show your best effort. Use the indicator as a goal to work toward during each repetition. 10. After each set of 10 deep breaths, practice coughing to be  sure your lungs are clear. If you have an incision (the cut made at the time of surgery), support your incision when coughing by placing a pillow or rolled up towels firmly against it. Once you are able to get out of bed, walk around indoors and cough well. You may stop using the incentive spirometer when instructed by your caregiver.  RISKS AND COMPLICATIONS  Take your time so you do not get dizzy or light-headed.  If you are in pain, you may need to take or ask for pain medication before doing incentive spirometry. It is harder to take a deep breath if you are having pain. AFTER USE  Rest and breathe slowly and easily.  It can be helpful to  keep track of a log of your progress. Your caregiver can provide you with a simple table to help with this. If you are using the spirometer at home, follow these instructions: [place] IF:   You are having difficultly using the spirometer.  You have trouble using the spirometer as often as instructed.  Your pain medication is not giving enough relief while using the spirometer.  You develop fever of 100.5 F (38.1 C) or higher. SEEK IMMEDIATE MEDICAL CARE IF:   You cough up bloody sputum that had not been present before.  You develop fever of 102 F (38.9 C) or greater.  You develop worsening pain at or near the incision site. MAKE SURE YOU:   Understand these instructions.  Will watch your condition.  Will get help right away if you are not doing well or get worse. Document Released: 11/06/2006 Document Revised: 09/18/2011 Document Reviewed: 01/07/2007 [facility] Patient Information 2014 [place], [place].   ________________________________________________________________________

## 2016-11-15 ENCOUNTER — Encounter (HOSPITAL_COMMUNITY)
Admission: RE | Admit: 2016-11-15 | Discharge: 2016-11-15 | Disposition: A | Discharge status: Home / Self Care | Payer: 59 | Source: Ambulatory Visit | Attending: Orthopedic Surgery | Admitting: Orthopedic Surgery

## 2016-11-15 ENCOUNTER — Encounter (HOSPITAL_COMMUNITY): Payer: Self-pay

## 2016-11-15 DIAGNOSIS — Z01812 Encounter for preprocedural laboratory examination: Secondary | ICD-10-CM | POA: Insufficient documentation

## 2016-11-15 DIAGNOSIS — Z0183 Encounter for blood typing: Secondary | ICD-10-CM | POA: Diagnosis not present

## 2016-11-15 DIAGNOSIS — M1711 Unilateral primary osteoarthritis, right knee: Secondary | ICD-10-CM | POA: Insufficient documentation

## 2016-11-15 HISTORY — DX: Other specified postprocedural states: R11.2

## 2016-11-15 HISTORY — DX: Other specified postprocedural states: Z98.890

## 2016-11-15 LAB — BASIC METABOLIC PANEL
ANION GAP: 8 (ref 5–15)
BUN: 19 mg/dL (ref 6–20)
CALCIUM: 9.4 mg/dL (ref 8.9–10.3)
CO2: 28 mmol/L (ref 22–32)
CREATININE: 0.78 mg/dL (ref 0.44–1.00)
Chloride: 103 mmol/L (ref 101–111)
GFR calc non Af Amer: >60 mL/min (ref >60)
Glucose, Bld: 100 mg/dL — ABNORMAL HIGH (ref 65–99)
Potassium: 4.1 mmol/L (ref 3.5–5.1)
Sodium: 139 mmol/L (ref 135–145)

## 2016-11-15 LAB — CBC
HCT: 39.3 % (ref 36.0–46.0)
Hemoglobin: 13 g/dL (ref 12.0–15.0)
MCH: 29.7 pg (ref 26.0–34.0)
MCHC: 33.1 g/dL (ref 30.0–36.0)
MCV: 89.9 fL (ref 78.0–100.0)
PLATELETS: 284 10*3/uL (ref 150–400)
RBC: 4.37 MIL/uL (ref 3.87–5.11)
RDW: 13.1 % (ref 11.5–15.5)
WBC: 6.6 10*3/uL (ref 4.0–10.5)

## 2016-11-15 LAB — SURGICAL PCR SCREEN
MRSA, PCR: NEGATIVE
Staphylococcus aureus: NEGATIVE

## 2016-11-15 NOTE — Progress Notes (Signed)
ekg 11/08/16 and 01/27/16 on chart Clearance from pcp on chart lov 07/06/16 chart

## 2016-11-20 ENCOUNTER — Inpatient Hospital Stay (HOSPITAL_COMMUNITY): Payer: PRIVATE HEALTH INSURANCE | Admitting: Certified Registered Nurse Anesthetist

## 2016-11-20 ENCOUNTER — Encounter (HOSPITAL_COMMUNITY)
Admission: RE | Disposition: A | Discharge status: Home / Self Care | Payer: Self-pay | Source: Ambulatory Visit | Attending: Orthopedic Surgery

## 2016-11-20 ENCOUNTER — Encounter (HOSPITAL_COMMUNITY): Payer: Self-pay | Admitting: *Deleted

## 2016-11-20 ENCOUNTER — Observation Stay (HOSPITAL_COMMUNITY)
Admission: RE | Admit: 2016-11-20 | Discharge: 2016-11-21 | Disposition: A | Discharge status: Home / Self Care | Payer: PRIVATE HEALTH INSURANCE | Source: Ambulatory Visit | Attending: Orthopedic Surgery | Admitting: Orthopedic Surgery

## 2016-11-20 DIAGNOSIS — G4730 Sleep apnea, unspecified: Secondary | ICD-10-CM | POA: Diagnosis not present

## 2016-11-20 DIAGNOSIS — Z96652 Presence of left artificial knee joint: Secondary | ICD-10-CM | POA: Insufficient documentation

## 2016-11-20 DIAGNOSIS — Z888 Allergy status to other drugs, medicaments and biological substances status: Secondary | ICD-10-CM | POA: Diagnosis not present

## 2016-11-20 DIAGNOSIS — Z96651 Presence of right artificial knee joint: Secondary | ICD-10-CM

## 2016-11-20 DIAGNOSIS — K219 Gastro-esophageal reflux disease without esophagitis: Secondary | ICD-10-CM | POA: Insufficient documentation

## 2016-11-20 DIAGNOSIS — Z79899 Other long term (current) drug therapy: Secondary | ICD-10-CM | POA: Insufficient documentation

## 2016-11-20 DIAGNOSIS — F419 Anxiety disorder, unspecified: Secondary | ICD-10-CM | POA: Diagnosis not present

## 2016-11-20 DIAGNOSIS — Z9889 Other specified postprocedural states: Secondary | ICD-10-CM | POA: Insufficient documentation

## 2016-11-20 DIAGNOSIS — Z87891 Personal history of nicotine dependence: Secondary | ICD-10-CM | POA: Diagnosis not present

## 2016-11-20 DIAGNOSIS — Z96659 Presence of unspecified artificial knee joint: Secondary | ICD-10-CM

## 2016-11-20 DIAGNOSIS — M1711 Unilateral primary osteoarthritis, right knee: Principal | ICD-10-CM | POA: Insufficient documentation

## 2016-11-20 DIAGNOSIS — Z85828 Personal history of other malignant neoplasm of skin: Secondary | ICD-10-CM | POA: Insufficient documentation

## 2016-11-20 DIAGNOSIS — Z96642 Presence of left artificial hip joint: Secondary | ICD-10-CM | POA: Diagnosis not present

## 2016-11-20 DIAGNOSIS — M25761 Osteophyte, right knee: Secondary | ICD-10-CM | POA: Insufficient documentation

## 2016-11-20 DIAGNOSIS — I10 Essential (primary) hypertension: Secondary | ICD-10-CM | POA: Insufficient documentation

## 2016-11-20 HISTORY — PX: TOTAL KNEE ARTHROPLASTY: SHX125

## 2016-11-20 LAB — TYPE AND SCREEN
ABO/RH(D): A POS
ANTIBODY SCREEN: NEGATIVE

## 2016-11-20 SURGERY — ARTHROPLASTY, KNEE, TOTAL
Anesthesia: Regional | Site: Knee | Laterality: Right

## 2016-11-20 MED ORDER — ALUM & MAG HYDROXIDE-SIMETH 200-200-20 MG/5ML PO SUSP: 15.0000 mL | ORAL | Status: DC | PRN

## 2016-11-20 MED ORDER — BUPIVACAINE HCL (PF) 0.25 % IJ SOLN
INTRAMUSCULAR | Status: DC | PRN
  Administered 2016-11-20: 30 mL

## 2016-11-20 MED ORDER — DEXAMETHASONE SODIUM PHOSPHATE 10 MG/ML IJ SOLN
INTRAMUSCULAR | Status: AC
  Filled 2016-11-20: qty 1

## 2016-11-20 MED ORDER — METOCLOPRAMIDE HCL 5 MG PO TABS: 5.0000 mg | ORAL_TABLET | Freq: Three times a day (TID) | ORAL | Status: DC | PRN

## 2016-11-20 MED ORDER — HYDROCODONE-ACETAMINOPHEN 7.5-325 MG PO TABS
1.0000 | ORAL_TABLET | ORAL | Status: DC
  Administered 2016-11-20: 1 via ORAL
  Administered 2016-11-20 – 2016-11-21 (×2): 2 via ORAL
  Administered 2016-11-21: 1 via ORAL
  Filled 2016-11-20 (×4): qty 1
  Filled 2016-11-20: qty 2
  Filled 2016-11-20: qty 1

## 2016-11-20 MED ORDER — DEXAMETHASONE SODIUM PHOSPHATE 10 MG/ML IJ SOLN
10.0000 mg | Freq: Once | INTRAMUSCULAR | Status: AC
  Administered 2016-11-21: 10 mg via INTRAVENOUS
  Filled 2016-11-20: qty 1

## 2016-11-20 MED ORDER — BUPIVACAINE HCL (PF) 0.25 % IJ SOLN
INTRAMUSCULAR | Status: AC
  Filled 2016-11-20: qty 30

## 2016-11-20 MED ORDER — MAGNESIUM CITRATE PO SOLN: 1.0000 | Freq: Once | ORAL | Status: DC | PRN

## 2016-11-20 MED ORDER — FERROUS SULFATE 325 (65 FE) MG PO TABS
325.0000 mg | ORAL_TABLET | Freq: Three times a day (TID) | ORAL | Status: DC
  Administered 2016-11-21: 09:00:00 325 mg via ORAL
  Filled 2016-11-20: qty 1

## 2016-11-20 MED ORDER — MENTHOL 3 MG MT LOZG: 1.0000 | LOZENGE | OROMUCOSAL | Status: DC | PRN

## 2016-11-20 MED ORDER — SODIUM CHLORIDE 0.9 % IR SOLN
Status: DC | PRN
  Administered 2016-11-20: 1000 mL

## 2016-11-20 MED ORDER — PROPOFOL 500 MG/50ML IV EMUL
INTRAVENOUS | Status: DC | PRN
Start: 2016-11-20 — End: 2016-11-20
  Administered 2016-11-20: 50 ug/kg/min via INTRAVENOUS

## 2016-11-20 MED ORDER — DIPHENHYDRAMINE HCL 25 MG PO CAPS: 25.0000 mg | ORAL_CAPSULE | Freq: Four times a day (QID) | ORAL | Status: DC | PRN

## 2016-11-20 MED ORDER — EPHEDRINE SULFATE-NACL 50-0.9 MG/10ML-% IV SOSY
PREFILLED_SYRINGE | INTRAVENOUS | Status: DC | PRN
  Administered 2016-11-20 (×4): 10 mg via INTRAVENOUS

## 2016-11-20 MED ORDER — FERROUS SULFATE 325 (65 FE) MG PO TABS: 325.0000 mg | ORAL_TABLET | Freq: Three times a day (TID) | ORAL | Status: AC

## 2016-11-20 MED ORDER — POTASSIUM CHLORIDE CRYS ER 10 MEQ PO TBCR
10.0000 meq | EXTENDED_RELEASE_TABLET | Freq: Every day | ORAL | Status: DC
  Administered 2016-11-21: 10 meq via ORAL
  Filled 2016-11-20: qty 1

## 2016-11-20 MED ORDER — DOCUSATE SODIUM 100 MG PO CAPS: 100.0000 mg | ORAL_CAPSULE | Freq: Two times a day (BID) | ORAL | 0 refills | Status: AC

## 2016-11-20 MED ORDER — HYDROMORPHONE HCL 1 MG/ML IJ SOLN
0.5000 mg | INTRAMUSCULAR | Status: DC | PRN
  Administered 2016-11-21: 0.5 mg via INTRAVENOUS
  Filled 2016-11-20: qty 0.5

## 2016-11-20 MED ORDER — DOCUSATE SODIUM 100 MG PO CAPS
100.0000 mg | ORAL_CAPSULE | Freq: Two times a day (BID) | ORAL | Status: DC
  Administered 2016-11-20 – 2016-11-21 (×2): 100 mg via ORAL
  Filled 2016-11-20 (×2): qty 1

## 2016-11-20 MED ORDER — BISACODYL 10 MG RE SUPP: 10.0000 mg | Freq: Every day | RECTAL | Status: DC | PRN

## 2016-11-20 MED ORDER — DEXTROSE 5 % IV SOLN
500.0000 mg | Freq: Four times a day (QID) | INTRAVENOUS | Status: DC | PRN
  Filled 2016-11-20 (×2): qty 5

## 2016-11-20 MED ORDER — FENTANYL CITRATE (PF) 100 MCG/2ML IJ SOLN
50.0000 ug | INTRAMUSCULAR | Status: DC | PRN
  Administered 2016-11-20: 25 ug via INTRAVENOUS

## 2016-11-20 MED ORDER — ONDANSETRON HCL 4 MG/2ML IJ SOLN
INTRAMUSCULAR | Status: DC | PRN
  Administered 2016-11-20: 4 mg via INTRAVENOUS

## 2016-11-20 MED ORDER — PROPOFOL 10 MG/ML IV BOLUS
INTRAVENOUS | Status: DC | PRN
  Administered 2016-11-20 (×4): 20 mg via INTRAVENOUS

## 2016-11-20 MED ORDER — KETOROLAC TROMETHAMINE 30 MG/ML IJ SOLN
INTRAMUSCULAR | Status: AC
  Filled 2016-11-20: qty 1

## 2016-11-20 MED ORDER — CHLORHEXIDINE GLUCONATE 4 % EX LIQD: 60.0000 mL | Freq: Once | CUTANEOUS | Status: DC

## 2016-11-20 MED ORDER — LIP MEDEX EX OINT
TOPICAL_OINTMENT | CUTANEOUS | Status: AC
  Administered 2016-11-20: 21:00:00
  Filled 2016-11-20: qty 7

## 2016-11-20 MED ORDER — ONDANSETRON HCL 4 MG/2ML IJ SOLN: 4.0000 mg | Freq: Four times a day (QID) | INTRAMUSCULAR | Status: DC | PRN

## 2016-11-20 MED ORDER — HYDROCODONE-ACETAMINOPHEN 7.5-325 MG PO TABS: 1.0000 | ORAL_TABLET | ORAL | 0 refills | Status: AC | PRN

## 2016-11-20 MED ORDER — PROPOFOL 10 MG/ML IV BOLUS
INTRAVENOUS | Status: AC
  Filled 2016-11-20: qty 60

## 2016-11-20 MED ORDER — FAMOTIDINE 20 MG PO TABS: 20.0000 mg | ORAL_TABLET | Freq: Every day | ORAL | Status: DC | PRN

## 2016-11-20 MED ORDER — ONDANSETRON HCL 4 MG/2ML IJ SOLN
INTRAMUSCULAR | Status: AC
  Filled 2016-11-20: qty 2

## 2016-11-20 MED ORDER — SODIUM CHLORIDE 0.9 % IJ SOLN
INTRAMUSCULAR | Status: AC
  Filled 2016-11-20: qty 50

## 2016-11-20 MED ORDER — CEFAZOLIN SODIUM-DEXTROSE 2-4 GM/100ML-% IV SOLN
2.0000 g | Freq: Four times a day (QID) | INTRAVENOUS | Status: AC
  Administered 2016-11-20 (×2): 2 g via INTRAVENOUS
  Filled 2016-11-20 (×2): qty 100

## 2016-11-20 MED ORDER — PROPOFOL 10 MG/ML IV BOLUS
INTRAVENOUS | Status: AC
  Filled 2016-11-20: qty 40

## 2016-11-20 MED ORDER — CEFAZOLIN SODIUM-DEXTROSE 2-4 GM/100ML-% IV SOLN
INTRAVENOUS | Status: AC
  Filled 2016-11-20: qty 100

## 2016-11-20 MED ORDER — EPHEDRINE 5 MG/ML INJ
INTRAVENOUS | Status: AC
  Filled 2016-11-20: qty 10

## 2016-11-20 MED ORDER — LACTATED RINGERS IV SOLN
INTRAVENOUS | Status: DC
Start: 2016-11-20 — End: 2016-11-20
  Administered 2016-11-20 (×3): via INTRAVENOUS

## 2016-11-20 MED ORDER — FENTANYL CITRATE (PF) 100 MCG/2ML IJ SOLN
INTRAMUSCULAR | Status: AC
  Administered 2016-11-20: 25 ug via INTRAVENOUS
  Filled 2016-11-20: qty 2

## 2016-11-20 MED ORDER — ROPIVACAINE HCL 5 MG/ML IJ SOLN
INTRAMUSCULAR | Status: DC | PRN
  Administered 2016-11-20: 30 mL via PERINEURAL

## 2016-11-20 MED ORDER — CHLORTHALIDONE 25 MG PO TABS
25.0000 mg | ORAL_TABLET | Freq: Every day | ORAL | Status: DC
  Administered 2016-11-21: 25 mg via ORAL
  Filled 2016-11-20: qty 1

## 2016-11-20 MED ORDER — PHENOL 1.4 % MT LIQD: 1.0000 | OROMUCOSAL | Status: DC | PRN

## 2016-11-20 MED ORDER — MIDAZOLAM HCL 2 MG/2ML IJ SOLN
1.0000 mg | INTRAMUSCULAR | Status: DC | PRN
  Administered 2016-11-20: 1 mg via INTRAVENOUS
  Filled 2016-11-20: qty 2

## 2016-11-20 MED ORDER — KETOROLAC TROMETHAMINE 30 MG/ML IJ SOLN
INTRAMUSCULAR | Status: DC | PRN
  Administered 2016-11-20: 30 mg

## 2016-11-20 MED ORDER — TRANEXAMIC ACID 1000 MG/10ML IV SOLN
1000.0000 mg | INTRAVENOUS | Status: AC
  Administered 2016-11-20: 1000 mg via INTRAVENOUS
  Filled 2016-11-20: qty 1100

## 2016-11-20 MED ORDER — ONDANSETRON HCL 4 MG PO TABS
4.0000 mg | ORAL_TABLET | Freq: Four times a day (QID) | ORAL | Status: DC | PRN
  Administered 2016-11-21: 4 mg via ORAL
  Filled 2016-11-20: qty 1

## 2016-11-20 MED ORDER — METOCLOPRAMIDE HCL 5 MG/ML IJ SOLN: 5.0000 mg | Freq: Three times a day (TID) | INTRAMUSCULAR | Status: DC | PRN

## 2016-11-20 MED ORDER — SODIUM CHLORIDE 0.9 % IV SOLN
INTRAVENOUS | Status: DC
  Administered 2016-11-20: 17:00:00 via INTRAVENOUS

## 2016-11-20 MED ORDER — ALPRAZOLAM 0.5 MG PO TABS
0.5000 mg | ORAL_TABLET | Freq: Every day | ORAL | Status: DC
  Administered 2016-11-20: 0.5 mg via ORAL
  Filled 2016-11-20: qty 1

## 2016-11-20 MED ORDER — ASPIRIN 81 MG PO CHEW
81.0000 mg | CHEWABLE_TABLET | Freq: Two times a day (BID) | ORAL | Status: DC
  Administered 2016-11-20 – 2016-11-21 (×2): 81 mg via ORAL
  Filled 2016-11-20 (×2): qty 1

## 2016-11-20 MED ORDER — LIDOCAINE HCL (PF) 1 % IJ SOLN
INTRAMUSCULAR | Status: DC | PRN
Start: 2016-11-20 — End: 2016-11-20
  Administered 2016-11-20: 2 mL

## 2016-11-20 MED ORDER — CEFAZOLIN SODIUM-DEXTROSE 2-4 GM/100ML-% IV SOLN
2.0000 g | INTRAVENOUS | Status: AC
  Administered 2016-11-20: 2 g via INTRAVENOUS

## 2016-11-20 MED ORDER — BUPIVACAINE IN DEXTROSE 0.75-8.25 % IT SOLN
INTRATHECAL | Status: DC | PRN
  Administered 2016-11-20: 1.8 mL via INTRATHECAL

## 2016-11-20 MED ORDER — SODIUM CHLORIDE 0.9 % IJ SOLN
INTRAMUSCULAR | Status: DC | PRN
  Administered 2016-11-20: 30 mL

## 2016-11-20 MED ORDER — MIDAZOLAM HCL 2 MG/2ML IJ SOLN
INTRAMUSCULAR | Status: AC
  Administered 2016-11-20: 1 mg via INTRAVENOUS
  Filled 2016-11-20: qty 2

## 2016-11-20 MED ORDER — ASPIRIN 81 MG PO CHEW: 81.0000 mg | CHEWABLE_TABLET | Freq: Two times a day (BID) | ORAL | 0 refills | Status: AC

## 2016-11-20 MED ORDER — METHOCARBAMOL 500 MG PO TABS
500.0000 mg | ORAL_TABLET | Freq: Four times a day (QID) | ORAL | Status: DC | PRN
  Administered 2016-11-20: 500 mg via ORAL
  Filled 2016-11-20 (×2): qty 1

## 2016-11-20 MED ORDER — METHOCARBAMOL 500 MG PO TABS: 500.0000 mg | ORAL_TABLET | Freq: Four times a day (QID) | ORAL | 0 refills | Status: AC | PRN

## 2016-11-20 MED ORDER — DEXAMETHASONE SODIUM PHOSPHATE 10 MG/ML IJ SOLN: INTRAMUSCULAR | Status: DC | PRN

## 2016-11-20 MED ORDER — DEXAMETHASONE SODIUM PHOSPHATE 10 MG/ML IJ SOLN
10.0000 mg | Freq: Once | INTRAMUSCULAR | Status: AC
  Administered 2016-11-20: 10 mg via INTRAVENOUS

## 2016-11-20 MED ORDER — CELECOXIB 200 MG PO CAPS
200.0000 mg | ORAL_CAPSULE | Freq: Two times a day (BID) | ORAL | Status: DC
  Administered 2016-11-20 – 2016-11-21 (×2): 200 mg via ORAL
  Filled 2016-11-20 (×2): qty 1

## 2016-11-20 MED ORDER — POLYETHYLENE GLYCOL 3350 17 G PO PACK: 17.0000 g | PACK | Freq: Two times a day (BID) | ORAL | 0 refills | Status: AC

## 2016-11-20 MED ORDER — POLYETHYLENE GLYCOL 3350 17 G PO PACK
17.0000 g | PACK | Freq: Two times a day (BID) | ORAL | Status: DC
  Filled 2016-11-20: qty 1

## 2016-11-20 SURGICAL SUPPLY — 48 items
BAG DECANTER FOR FLEXI CONT (MISCELLANEOUS) IMPLANT
BAG ZIPLOCK 12X15 (MISCELLANEOUS) IMPLANT
BANDAGE ACE 6X5 VEL STRL LF (GAUZE/BANDAGES/DRESSINGS) ×3 IMPLANT
BLADE SAW SGTL 11.0X1.19X90.0M (BLADE) IMPLANT
BLADE SAW SGTL 13.0X1.19X90.0M (BLADE) ×3 IMPLANT
BOWL SMART MIX CTS (DISPOSABLE) ×3 IMPLANT
CAPT KNEE TOTAL 3 ATTUNE ×3 IMPLANT
CEMENT HV SMART SET (Cement) ×6 IMPLANT
COVER SURGICAL LIGHT HANDLE (MISCELLANEOUS) ×3 IMPLANT
CUFF TOURN SGL QUICK 34 (TOURNIQUET CUFF) ×2
CUFF TRNQT CYL 34X4X40X1 (TOURNIQUET CUFF) ×1 IMPLANT
DECANTER SPIKE VIAL GLASS SM (MISCELLANEOUS) ×3 IMPLANT
DERMABOND ADVANCED (GAUZE/BANDAGES/DRESSINGS) ×2
DERMABOND ADVANCED .7 DNX12 (GAUZE/BANDAGES/DRESSINGS) ×1 IMPLANT
DRAPE U-SHAPE 47X51 STRL (DRAPES) ×3 IMPLANT
DRESSING AQUACEL AG SP 3.5X10 (GAUZE/BANDAGES/DRESSINGS) ×1 IMPLANT
DRSG AQUACEL AG SP 3.5X10 (GAUZE/BANDAGES/DRESSINGS) ×3
DURAPREP 26ML APPLICATOR (WOUND CARE) ×6 IMPLANT
ELECT REM PT RETURN 15FT ADLT (MISCELLANEOUS) ×3 IMPLANT
GLOVE BIOGEL M 7.0 STRL (GLOVE) IMPLANT
GLOVE BIOGEL PI IND STRL 7.0 (GLOVE) ×4 IMPLANT
GLOVE BIOGEL PI IND STRL 7.5 (GLOVE) ×1 IMPLANT
GLOVE BIOGEL PI IND STRL 8.5 (GLOVE) ×1 IMPLANT
GLOVE BIOGEL PI INDICATOR 7.0 (GLOVE) ×8
GLOVE BIOGEL PI INDICATOR 7.5 (GLOVE) ×2
GLOVE BIOGEL PI INDICATOR 8.5 (GLOVE) ×2
GLOVE ECLIPSE 8.0 STRL XLNG CF (GLOVE) ×6 IMPLANT
GLOVE ORTHO TXT STRL SZ7.5 (GLOVE) ×6 IMPLANT
GOWN STRL REUS W/ TWL XL LVL3 (GOWN DISPOSABLE) ×1 IMPLANT
GOWN STRL REUS W/TWL LRG LVL3 (GOWN DISPOSABLE) ×9 IMPLANT
GOWN STRL REUS W/TWL XL LVL3 (GOWN DISPOSABLE) ×5 IMPLANT
HANDPIECE INTERPULSE COAX TIP (DISPOSABLE) ×2
MANIFOLD NEPTUNE II (INSTRUMENTS) ×3 IMPLANT
PACK TOTAL KNEE CUSTOM (KITS) ×3 IMPLANT
POSITIONER SURGICAL ARM (MISCELLANEOUS) ×3 IMPLANT
SET HNDPC FAN SPRY TIP SCT (DISPOSABLE) ×1 IMPLANT
SET PAD KNEE POSITIONER (MISCELLANEOUS) ×3 IMPLANT
SUT MNCRL AB 4-0 PS2 18 (SUTURE) ×3 IMPLANT
SUT STRATAFIX 0 PDS 27 VIOLET (SUTURE) ×3
SUT VIC AB 1 CT1 36 (SUTURE) ×3 IMPLANT
SUT VIC AB 2-0 CT1 27 (SUTURE) ×6
SUT VIC AB 2-0 CT1 TAPERPNT 27 (SUTURE) ×3 IMPLANT
SUTURE STRATFX 0 PDS 27 VIOLET (SUTURE) ×1 IMPLANT
SYR 50ML LL SCALE MARK (SYRINGE) ×3 IMPLANT
TRAY FOLEY W/METER SILVER 16FR (SET/KITS/TRAYS/PACK) ×3 IMPLANT
WATER STERILE IRR 1500ML POUR (IV SOLUTION) ×3 IMPLANT
WRAP KNEE MAXI GEL POST OP (GAUZE/BANDAGES/DRESSINGS) ×3 IMPLANT
YANKAUER SUCT BULB TIP 10FT TU (MISCELLANEOUS) ×3 IMPLANT

## 2016-11-20 NOTE — Op Note (Signed)
NAME:  Mary Garner RECORD NO.:  [id number]                             FACILITY:  [facility]      PHYSICIAN:  Pietro Cassis. Alvan Dame, M.D.  DATE OF BIRTH:  13-Jan-1951      DATE OF PROCEDURE:  11/20/2016                                     OPERATIVE REPORT         PREOPERATIVE DIAGNOSIS:  Right knee osteoarthritis.      POSTOPERATIVE DIAGNOSIS:  Right knee osteoarthritis.      FINDINGS:  The patient was noted to have complete loss of cartilage and   bone-on-bone arthritis with associated osteophytes in the medial and patellofemoral compartments of   the knee with a significant synovitis and associated effusion.      PROCEDURE:  Right total knee replacement.      COMPONENTS USED:  DePuy Attune rotating platform posterior stabilized knee   system, a size 7 femur, 6 tibia, size 7 PS AOX insert, and 38 anatomic patellar   button.      SURGEON:  Pietro Cassis. Alvan Dame, M.D.      ASSISTANT:  Danae Orleans, PA-C.      ANESTHESIA:  Regional and Spinal.      SPECIMENS:  None.      COMPLICATION:  None.      DRAINS:  None.  EBL: <100cc      TOURNIQUET TIME:   Total Tourniquet Time Documented: Thigh (Right) - 30 minutes Total: Thigh (Right) - 30 minutes  .      The patient was stable to the recovery room.      INDICATION FOR PROCEDURE:  Mary Garner is a 66 y.o. female patient of   mine.  The patient had been seen, evaluated, and treated conservatively in the   office with medication, activity modification, and injections.  The patient had   radiographic changes of bone-on-bone arthritis with endplate sclerosis and osteophytes noted.      The patient failed conservative measures including medication, injections, and activity modification, and at this point was ready for more definitive measures.   Based on the radiographic changes and failed conservative measures, the patient   decided to proceed with total knee replacement.  Risks of infection,   DVT, component  failure, need for revision surgery, postop course, and   expectations were all   discussed and reviewed.  Consent was obtained for benefit of pain   relief.      PROCEDURE IN DETAIL:  The patient was brought to the operative theater.   Once adequate anesthesia, preoperative antibiotics, 2 gm of Ancef, 1 gm of Tranexamic Acid, and 10 mg of Decadron administered, the patient was positioned supine with the right thigh tourniquet placed.  The  right lower extremity was prepped and draped in sterile fashion.  A time-   out was performed identifying the patient, planned procedure, and   extremity.      The right lower extremity was placed in the [facility] leg holder.  The leg was   exsanguinated, tourniquet elevated to 250 mmHg.  A midline incision was  made followed by median parapatellar arthrotomy.  Following initial   exposure, attention was first directed to the patella.  Precut   measurement was noted to be 24 mm.  I resected down to 14 mm and used a   38 anatomic patellar button to restore patellar height as well as cover the cut   surface.      The lug holes were drilled and a metal shim was placed to protect the   patella from retractors and saw blades.      At this point, attention was now directed to the femur.  The femoral   canal was opened with a drill, irrigated to try to prevent fat emboli.  An   intramedullary rod was passed at 5 degrees valgus, 9 mm of bone was   resected off the distal femur.  Following this resection, the tibia was   subluxated anteriorly.  Using the extramedullary guide, 2 mm of bone was resected off   the proximal medial tibia.  We confirmed the gap would be   stable medially and laterally with a size 5 spacer block as well as confirmed   the cut was perpendicular in the coronal plane, checking with an alignment rod.      Once this was done, I sized the femur to be a size 7 in the anterior-   posterior dimension, chose a standard component based on  medial and   lateral dimension.  The size 7 rotation block was then pinned in   position anterior referenced using the C-clamp to set rotation.  The   anterior, posterior, and  chamfer cuts were made without difficulty nor   notching making certain that I was along the anterior cortex to help   with flexion gap stability.      The final box cut was made off the lateral aspect of distal femur.      At this point, the tibia was sized to be a size 6, the size 6 tray was   then pinned in position through the medial third of the tubercle,   drilled, and keel punched.  Trial reduction was now carried with a 7 femur,  6 tibia, a size 6 then 7 PS insert, and the 38 anatomic patella botton.  The knee was brought to   extension, full extension with good flexion stability with the patella   tracking through the trochlea without application of pressure.  Given   all these findings the femoral lug holes were drilled and then the trial components removed.  Final components were   opened and cement was mixed.  The knee was irrigated with normal saline   solution and pulse lavage.  The synovial lining was   then injected with 30 cc of 0.25% Marcaine without epinephrine and 1 cc of Toradol plus 30 cc of NS for a total of 61 cc.      The knee was irrigated.  Final implants were then cemented onto clean and   dried cut surfaces of bone with the knee brought to extension with a size 7 PS trial insert.      Once the cement had fully cured, the excess cement was removed   throughout the knee.  I confirmed I was satisfied with the range of   motion and stability, and the final size 7 PS AOX insert was chosen.  It was   placed into the knee.      The tourniquet had been let down at 30  minutes.  No significant   hemostasis required.  The   extensor mechanism was then reapproximated using #1 Vicryl and #0 Stratafix sutures with the knee   in flexion.  The   remaining wound was closed with 2-0 Vicryl and  running 4-0 Monocryl.   The knee was cleaned, dried, dressed sterilely using Dermabond and   Aquacel dressing.  The patient was then   brought to recovery room in stable condition, tolerating the procedure   well.   Please note that Physician Assistant, Danae Orleans, PA-C, was present for the entirety of the case, and was utilized for pre-operative positioning, peri-operative retractor management, general facilitation of the procedure.  He was also utilized for primary wound closure at the end of the case.              Pietro Cassis Alvan Dame, M.D.    11/20/2016 11:46 AM

## 2016-11-20 NOTE — Interval H&P Note (Signed)
History and Physical Interval Note:  11/20/2016 9:21 AM  Mary Garner  has presented today for surgery, with the diagnosis of right knee osteoarthritis  The various methods of treatment have been discussed with the patient and family. After consideration of risks, benefits and other options for treatment, the patient has consented to  Procedure(s): RIGHT TOTAL KNEE ARTHROPLASTY (Right) as a surgical intervention .  The patient's history has been reviewed, patient examined, no change in status, stable for surgery.  I have reviewed the patient's chart and labs.  Questions were answered to the patient's satisfaction.     [name]

## 2016-11-20 NOTE — Anesthesia Postprocedure Evaluation (Signed)
Anesthesia Post Note  Patient: Mary Garner  Procedure(s) Performed: Procedure(s) (LRB): RIGHT TOTAL KNEE ARTHROPLASTY (Right)  Patient location during evaluation: PACU Anesthesia Type: Regional and Spinal Level of consciousness: awake and alert Pain management: pain level controlled Vital Signs Assessment: post-procedure vital signs reviewed and stable Respiratory status: spontaneous breathing and respiratory function stable Cardiovascular status: blood pressure returned to baseline and stable Postop Assessment: spinal receding Anesthetic complications: no       Last Vitals:  Vitals:   11/20/16 1315 11/20/16 1415  BP: 138/75 137/66  Pulse: (!) 58 66  Resp: 14 17  Temp: 36.4 C 37 C    Last Pain:  Vitals:   11/20/16 1415  TempSrc: Oral  PainSc:                  Ryan P Ellender

## 2016-11-20 NOTE — Evaluation (Signed)
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  Pt will benefit from skilled PT to increase their independence and safety with mobility to allow discharge to the venue listed below.  Pt mobilizing well POD #0 and plans to d/c home with family to assist for a week.     Follow Up Recommendations Outpatient PT;DC plan and follow up therapy as arranged by surgeon    Equipment Recommendations  None recommended by PT    Recommendations for Other Services       Precautions / Restrictions Precautions Precautions: Knee Restrictions Other Position/Activity Restrictions: WBAT      Mobility  Bed Mobility Overal bed mobility: Needs Assistance Bed Mobility: Supine to Sit     Supine to sit: Supervision;HOB elevated        Transfers Overall transfer level: Needs assistance Equipment used: Rolling walker (2 wheeled) Transfers: Sit to/from Stand Sit to Stand: Min guard;From elevated surface         General transfer comment: verbal cues for UE and LE positioning  Ambulation/Gait Ambulation/Gait assistance: Min guard Ambulation Distance (Feet): 80 Feet Assistive device: Rolling walker (2 wheeled) Gait Pattern/deviations: Step-to pattern;Antalgic;Decreased stance time - right     General Gait Details: verbal cues for sequence, RW positioning, step length  Stairs            Wheelchair Mobility    Modified Rankin (Stroke Patients Only)       Balance                                             Pertinent Vitals/Pain Pain Assessment: 0-10 Pain Score: 3  Pain Location: incision Pain Descriptors / Indicators:  ("stinging") Pain Intervention(s): Limited activity within patient's  tolerance;Monitored during session;Repositioned    Home Living Family/patient expects to be discharged to:: Private residence Living Arrangements: Alone Available Help at Discharge: Family;Available 24 hours/day Type of Home: House Home Access: Level entry     Home Layout: One level Home Equipment: Walker - 2 wheels;Cane - single point;Bedside commode      Prior Function Level of Independence: Independent               Hand Dominance        Extremity/Trunk Assessment        Lower Extremity Assessment Lower Extremity Assessment: RLE deficits/detail RLE Deficits / Details: able to perform SLR, active knee flexion at least 60* per functional observation       Communication   Communication: No difficulties  Cognition Arousal/Alertness: Awake/alert Behavior During Therapy: WFL for tasks assessed/performed Overall Cognitive Status: Within Functional Limits for tasks assessed                                        General Comments      Exercises     Assessment/Plan    PT Assessment Patient needs continued PT services  PT Problem List Decreased strength;Decreased range of motion;Decreased mobility;Decreased knowledge of use of DME  PT Treatment Interventions Functional mobility training;Stair training;Gait training;DME instruction;Therapeutic activities;Therapeutic exercise;Patient/family education    PT Goals (Current goals can be found in the Care Plan section)  Acute Rehab PT Goals PT Goal Formulation: With patient Time For Goal Achievement: 11/24/16 Potential to Achieve Goals: Good    Frequency 7X/week   Barriers to discharge        Co-evaluation               AM-PAC PT "6 Clicks" Daily Activity  Outcome Measure Difficulty turning over in bed (including adjusting bedclothes, sheets and blankets)?: None Difficulty moving from lying on back to sitting on the side of the bed? : A Little Difficulty sitting down on and  standing up from a chair with arms (Garner.g., wheelchair, bedside commode, etc,.)?: A Little Help needed moving to and from a bed to chair (including a wheelchair)?: A Little Help needed walking in hospital room?: A Little Help needed climbing 3-5 steps with a railing? : A Little 6 Click Score: 19    End of Session   Activity Tolerance: Patient tolerated treatment well Patient left: in chair;with call bell/phone within reach;with chair alarm set Nurse Communication: Mobility status PT Visit Diagnosis: Other abnormalities of gait and mobility (R26.89)    Time: 7619-5093 PT Time Calculation (min) (ACUTE ONLY): 14 min   Charges:   PT Evaluation $PT Eval Low Complexity: 1 Procedure     PT G Codes:   PT G-Codes **NOT FOR INPATIENT CLASS** Functional Assessment Tool Used: AM-PAC 6 Clicks Basic Mobility;Clinical judgement Functional Limitation: Mobility: Walking and moving around Mobility: Walking and Moving Around Current Status (O6712): At least 20 percent but less than 40 percent impaired, limited or restricted Mobility: Walking and Moving Around Goal Status [phone]): At least 1 percent but less than 20 percent impaired, limited or restricted   Carmelia Bake, PT, DPT 11/20/2016 Pager: [phone]   Mary Garner 11/20/2016, 5:33 PM

## 2016-11-20 NOTE — Discharge Instructions (Signed)

## 2016-11-20 NOTE — Progress Notes (Signed)
RT placed patient on CPAP. Patient setting is 7 cmH2O per comfort level. Sterile water added to water chamber for humidification. Patient is tolerating well.

## 2016-11-20 NOTE — Anesthesia Procedure Notes (Addendum)
Anesthesia Regional Block: Adductor canal block   Pre-Anesthetic Checklist: ,, timeout performed, Correct Patient, Correct Site, Correct Laterality, Correct Procedure,, site marked, risks and benefits discussed, Surgical consent,  Pre-op evaluation,  At surgeon's request and post-op pain management  Laterality: Right  Prep: Maximum Sterile Barrier Precautions used, chloraprep       Needles:  Injection technique: Single-shot  Needle Type: Echogenic Stimulator Needle     Needle Length: 9cm  Needle Gauge: 21     Additional Needles:   Procedures: ultrasound guided, nerve stimulator,,,,,,   Nerve Stimulator or Paresthesia:  Response: Quadriceps muscle contraction, 0.45 mA,   Additional Responses:   Narrative:  Start time: 11/20/2016 9:40 AM End time: 11/20/2016 9:50 AM Injection made incrementally with aspirations every 5 mL.  Performed by: Personally  Anesthesiologist: Adele Barthel P  Additional Notes: Functioning IV was confirmed and monitors were applied.  A 12mm 21ga Arrow echogenic stimulator needle was used. Sterile prep and drape,hand hygiene and sterile gloves were used.  Negative aspiration and negative test dose prior to incremental administration of local anesthetic. The patient tolerated the procedure well.

## 2016-11-20 NOTE — Transfer of Care (Signed)
Immediate Anesthesia Transfer of Care Note  Patient: Mary Garner  Procedure(s) Performed: Procedure(s): RIGHT TOTAL KNEE ARTHROPLASTY (Right)  Patient Location: PACU  Anesthesia Type:Regional and Spinal  Level of Consciousness:  sedated, patient cooperative and responds to stimulation  Airway & Oxygen Therapy:Patient Spontanous Breathing and Patient connected to face mask oxgen  Post-op Assessment:  Report given to PACU RN and Post -op Vital signs reviewed and stable  Post vital signs:  Reviewed and stable  Last Vitals:  Vitals:   11/20/16 0922 11/20/16 0924  BP:    Pulse: 70 64  Resp:    Temp:      Complications: No apparent anesthesia complications

## 2016-11-20 NOTE — Anesthesia Procedure Notes (Signed)
Spinal  Patient location during procedure: OR Start time: 11/20/2016 10:18 AM End time: 11/20/2016 10:22 AM Staffing Anesthesiologist: Adele Barthel P Resident/CRNA: Darlys Gales R Performed: resident/CRNA  Preanesthetic Checklist Completed: patient identified, site marked, surgical consent, pre-op evaluation, timeout performed, IV checked, risks and benefits discussed and monitors and equipment checked Spinal Block Patient position: sitting Prep: DuraPrep Patient monitoring: heart rate, cardiac monitor, continuous pulse ox and blood pressure Approach: midline Location: L3-4 Injection technique: single-shot Needle Needle type: Pencan  Needle gauge: 24 G Needle length: 10 cm Needle insertion depth: 8 cm Assessment Sensory level: T6

## 2016-11-20 NOTE — Anesthesia Preprocedure Evaluation (Addendum)
Anesthesia Evaluation  Patient identified by MRN, date of birth, ID band Patient awake    Reviewed: Allergy & Precautions, NPO status , Patient's Chart, lab work & pertinent test results  Airway Mallampati: III  TM Distance: >3 FB Neck ROM: Full    Dental no notable dental hx.    Pulmonary sleep apnea and Continuous Positive Airway Pressure Ventilation , former smoker,    Pulmonary exam normal breath sounds clear to auscultation       Cardiovascular hypertension, Pt. on medications Normal cardiovascular exam Rhythm:Regular Rate:Normal     Neuro/Psych negative neurological ROS  negative psych ROS   GI/Hepatic Neg liver ROS, GERD  Medicated and Controlled,  Endo/Other  negative endocrine ROS  Renal/GU negative Renal ROS  negative genitourinary   Musculoskeletal negative musculoskeletal ROS (+)   Abdominal   Peds negative pediatric ROS (+)  Hematology negative hematology ROS (+)   Anesthesia Other Findings obese  Reproductive/Obstetrics negative OB ROS                            Anesthesia Physical Anesthesia Plan  ASA: III  Anesthesia Plan: Spinal and Regional   Post-op Pain Management:    Induction: Intravenous  Airway Management Planned:   Additional Equipment:   Intra-op Plan:   Post-operative Plan:   Informed Consent: I have reviewed the patients History and Physical, chart, labs and discussed the procedure including the risks, benefits and alternatives for the proposed anesthesia with the patient or authorized representative who has indicated his/her understanding and acceptance.   Dental advisory given  Plan Discussed with: CRNA and Surgeon  Anesthesia Plan Comments:        Anesthesia Quick Evaluation

## 2016-11-20 NOTE — Progress Notes (Signed)
AssistedDr. Ellender with right, ultrasound guided, adductor canal block. Side rails up, monitors on throughout procedure. See vital signs in flow sheet. Tolerated Procedure well.  

## 2016-11-21 ENCOUNTER — Encounter (HOSPITAL_COMMUNITY): Payer: Self-pay | Admitting: Orthopedic Surgery

## 2016-11-21 DIAGNOSIS — M1711 Unilateral primary osteoarthritis, right knee: Secondary | ICD-10-CM | POA: Diagnosis not present

## 2016-11-21 LAB — BASIC METABOLIC PANEL
Anion gap: 7 (ref 5–15)
BUN: 18 mg/dL (ref 6–20)
CO2: 26 mmol/L (ref 22–32)
Calcium: 8.5 mg/dL — ABNORMAL LOW (ref 8.9–10.3)
Chloride: 105 mmol/L (ref 101–111)
Creatinine, Ser: 0.69 mg/dL (ref 0.44–1.00)
GFR calc Af Amer: >60 mL/min (ref >60)
GLUCOSE: 135 mg/dL — AB (ref 65–99)
Potassium: 3.9 mmol/L (ref 3.5–5.1)
SODIUM: 138 mmol/L (ref 135–145)

## 2016-11-21 LAB — CBC
HCT: 29.2 % — ABNORMAL LOW (ref 36.0–46.0)
Hemoglobin: 9.9 g/dL — ABNORMAL LOW (ref 12.0–15.0)
MCH: 30.4 pg (ref 26.0–34.0)
MCHC: 33.9 g/dL (ref 30.0–36.0)
MCV: 89.6 fL (ref 78.0–100.0)
PLATELETS: 235 10*3/uL (ref 150–400)
RBC: 3.26 MIL/uL — AB (ref 3.87–5.11)
RDW: 13.2 % (ref 11.5–15.5)
WBC: 11.8 10*3/uL — ABNORMAL HIGH (ref 4.0–10.5)

## 2016-11-21 NOTE — Progress Notes (Signed)
Patient ID: Mary Garner, female   DOB: 1950/09/25, 66 y.o.   MRN: [id number] Subjective: 1 Day Post-Op Procedure(s) (LRB): RIGHT TOTAL KNEE ARTHROPLASTY (Right)    Patient reports pain as mild at this point after getting relief during night of some more intense pain. Ready to go home, knows what to do as she has been through this before with his left.  Objective:   VITALS:   Vitals:   11/21/16 0213 11/21/16 0557  BP: (!) 106/51 (!) 117/53  Pulse: 63 71  Resp: 18 (!) 71  Temp: 98.3 F (36.8 C) 97.4 F (36.3 C)    Neurovascular intact Incision: dressing C/D/I  LABS  Recent Labs  11/21/16 0412  HGB 9.9*  HCT 29.2*  WBC 11.8*  PLT 235     Recent Labs  11/21/16 0412  NA 138  K 3.9  BUN 18  CREATININE 0.69  GLUCOSE 135*    No results for input(s): LABPT, INR in the last 72 hours.   Assessment/Plan: 1 Day Post-Op Procedure(s) (LRB): RIGHT TOTAL KNEE ARTHROPLASTY (Right)   Advance diet Up with therapy  D/C to home today after therapy Outpt PT already set up Reviewed goals Rx on chart

## 2016-11-21 NOTE — Evaluation (Signed)
Occupational Therapy Evaluation Patient Details Name: Mary Garner MRN: [id number] DOB: [birth year] Today's Date: 11/21/2016    History of Present Illness Pt is a 66 year old female s/p R TKA with hx of L TKA and L THA   Clinical Impression   This 66 y/o F presents with the above. At baseline Pt is independent with ADLs and functional mobility. Pt currently requires supervision for functional mobility and MinGuard Assist for LB ADLs. Pt lives alone, but will have family assistance for the first week after discharge for ADL assistance PRN. Pt reports having no concerns completing ADLs and functional mobility upon return home. Education provided and questions answered throughout session. No further OT needs identified at this time. Will sign off.     Follow Up Recommendations  No OT follow up;Supervision/Assistance - 24 hour (initially )    Equipment Recommendations  None recommended by OT           Precautions / Restrictions Precautions Precautions: Knee Restrictions Weight Bearing Restrictions: No Other Position/Activity Restrictions: WBAT      Mobility Bed Mobility Overal bed mobility: Modified Independent Bed Mobility: Supine to Sit     Supine to sit: Supervision;HOB elevated        Transfers Overall transfer level: Needs assistance Equipment used: Rolling walker (2 wheeled) Transfers: Sit to/from Stand Sit to Stand: Supervision         General transfer comment: Pt completed sit to stand with accurate UE/LE placement     Balance Overall balance assessment: No apparent balance deficits (not formally assessed)                                         ADL either performed or assessed with clinical judgement   ADL Overall ADL's : Needs assistance/impaired Eating/Feeding: Independent;Sitting   Grooming: Wash/dry hands;Supervision/safety;Standing   Upper Body Bathing: Set up;Sitting   Lower Body Bathing: Min guard;Sit to/from stand   Upper  Body Dressing : Set up;Sitting   Lower Body Dressing: Min guard;Sit to/from stand   Toilet Transfer: Supervision/safety;Ambulation;BSC;RW Toilet Transfer Details (indicate cue type and reason): BSC over toilet  Toileting- Clothing Manipulation and Hygiene: Supervision/safety;Sit to/from stand   Tub/ Shower Transfer: Walk-in shower;Min guard;Cueing for Architectural technologist Details (indicate cue type and reason): educated on safety during showering as Pt prefers to stand to complete task; Pt's daughter plans to offer supverision/assistance PRN during first week while Pt completes task to increase safety Functional mobility during ADLs: Supervision/safety;Rolling walker                           Pertinent Vitals/Pain Pain Assessment: Faces Pain Score: 3  Faces Pain Scale: Hurts a little bit Pain Location: R knee  Pain Descriptors / Indicators: Aching;Sore Pain Intervention(s): Limited activity within patient's tolerance;Monitored during session;Ice applied     Hand Dominance     Extremity/Trunk Assessment Upper Extremity Assessment Upper Extremity Assessment: Overall WFL for tasks assessed           Communication Communication Communication: No difficulties   Cognition Arousal/Alertness: Awake/alert Behavior During Therapy: WFL for tasks assessed/performed Overall Cognitive Status: Within Functional Limits for tasks assessed  General Comments                Home Living Family/patient expects to be discharged to:: Private residence Living Arrangements: Alone Available Help at Discharge: Family;Available 24 hours/day (family staying first week ) Type of Home: House Home Access: Level entry           Bathroom Shower/Tub: Walk-in shower   Bathroom Toilet: Handicapped height     Home Equipment: Environmental consultant - 2 wheels;Cane - single point;Bedside commode          Prior  Functioning/Environment Level of Independence: Independent                 OT Problem List: Decreased strength;Decreased activity tolerance      OT Treatment/Interventions:      OT Goals(Current goals can be found in the care plan section) Acute Rehab OT Goals Patient Stated Goal: get back to doing everything she was doing before, including mowing her lawn  OT Goal Formulation: With patient                                 AM-PAC PT "6 Clicks" Daily Activity     Outcome Measure Help from another person eating meals?: None Help from another person taking care of personal grooming?: A Little Help from another person toileting, which includes using toliet, bedpan, or urinal?: A Little Help from another person bathing (including washing, rinsing, drying)?: A Little Help from another person to put on and taking off regular upper body clothing?: A Little Help from another person to put on and taking off regular lower body clothing?: A Little 6 Click Score: 19   End of Session Equipment Utilized During Treatment: Gait belt;Rolling walker Nurse Communication: Mobility status  Activity Tolerance: Patient tolerated treatment well Patient left: in bed;with call bell/phone within reach;with family/visitor present  OT Visit Diagnosis: Muscle weakness (generalized) (M62.81)                Time: 9211-9417 OT Time Calculation (min): 19 min Charges:  OT General Charges $OT Visit: 1 Procedure OT Evaluation $OT Eval Low Complexity: 1 Procedure G-Codes: OT G-codes **NOT FOR INPATIENT CLASS** Functional Assessment Tool Used: AM-PAC 6 Clicks Daily Activity;Clinical judgement Functional Limitation: Self care Self Care Current Status (E0814): At least 20 percent but less than 40 percent impaired, limited or restricted Self Care Goal Status (G8185): At least 20 percent but less than 40 percent impaired, limited or restricted Self Care Discharge Status [phone]): At least 20 percent  but less than 40 percent impaired, limited or restricted   Lou Cal, OT Pager [phone] 11/21/2016   Raymondo Band 11/21/2016, 1:13 PM

## 2016-11-21 NOTE — Progress Notes (Signed)
Patient is alert and oriented x4, vital signs are stable, incision is within normal limits,pain controlled with oral pain medication, prescription given and discharge instructions reviewed with patient, patient's daughter and son-in-law, iv removed, questions and concerns answered Mary Garner 11:55 AM 11-21-2016

## 2016-11-21 NOTE — Progress Notes (Signed)
Physical Therapy Treatment Patient Details Name: Mary Garner MRN: [id number] DOB: Dec 06, 1950 Today's Date: 11/21/2016    History of Present Illness Pt is a 66 year old female s/p R TKA with hx of L TKA and L THA    PT Comments    Pt ambulated well in hallway and performed LE exercises.  Pt provided with HEP handout and plans to f/u with outpatient PT.  Follow Up Recommendations  Outpatient PT;DC plan and follow up therapy as arranged by surgeon     Equipment Recommendations  None recommended by PT    Recommendations for Other Services       Precautions / Restrictions Precautions Precautions: Knee Restrictions Other Position/Activity Restrictions: WBAT    Mobility  Bed Mobility Overal bed mobility: Modified Independent                Transfers Overall transfer level: Needs assistance Equipment used: Rolling walker (2 wheeled) Transfers: Sit to/from Stand Sit to Stand: Supervision         General transfer comment: verbal cues for UE and LE positioning  Ambulation/Gait Ambulation/Gait assistance: Supervision Ambulation Distance (Feet): 140 Feet Assistive device: Rolling walker (2 wheeled) Gait Pattern/deviations: Antalgic;Decreased stance time - right;Step-through pattern     General Gait Details: verbal cues for sequence, RW positioning, step length   Stairs            Wheelchair Mobility    Modified Rankin (Stroke Patients Only)       Balance                                            Cognition Arousal/Alertness: Awake/alert Behavior During Therapy: WFL for tasks assessed/performed Overall Cognitive Status: Within Functional Limits for tasks assessed                                        Exercises Total Joint Exercises Ankle Circles/Pumps: AROM;Both;10 reps Quad Sets: AROM;Both;10 reps Towel Squeeze: AROM;Both;10 reps Short Arc Quad: AROM;Right;10 reps Heel Slides: AAROM;Right;10  reps;Seated Hip ABduction/ADduction: AROM;Right;10 reps Straight Leg Raises: AROM;Right;10 reps Goniometric ROM: approx 85* AAROM knee flexion sitting edge of bed    General Comments        Pertinent Vitals/Pain Pain Assessment: 0-10 Pain Score: 3  Pain Location: incision Pain Descriptors / Indicators:  (stinging) Pain Intervention(s): Limited activity within patient's tolerance;Monitored during session;Repositioned;Ice applied    Home Living                      Prior Function            PT Goals (current goals can now be found in the care plan section) Progress towards PT goals: Progressing toward goals    Frequency    7X/week      PT Plan Current plan remains appropriate    Co-evaluation              AM-PAC PT "6 Clicks" Daily Activity  Outcome Measure  Difficulty turning over in bed (including adjusting bedclothes, sheets and blankets)?: None Difficulty moving from lying on back to sitting on the side of the bed? : None Difficulty sitting down on and standing up from a chair with arms (e.g., wheelchair, bedside commode, etc,.)?: A Little Help needed moving to  and from a bed to chair (including a wheelchair)?: A Little Help needed walking in hospital room?: A Little Help needed climbing 3-5 steps with a railing? : A Little 6 Click Score: 20    End of Session   Activity Tolerance: Patient tolerated treatment well Patient left: with call bell/phone within reach;in bed   PT Visit Diagnosis: Other abnormalities of gait and mobility (R26.89)     Time: 1916-6060 PT Time Calculation (min) (ACUTE ONLY): 20 min  Charges:  $Therapeutic Exercise: 8-22 mins                    G Codes:       Carmelia Bake, PT, DPT 11/21/2016 Pager: [phone]    York Ram E 11/21/2016, 12:24 PM

## 2016-11-21 NOTE — Progress Notes (Signed)
Discharge planning, no HH needs identified. Setup for OP therapy, no DME needs. [phone]

## 2016-11-22 NOTE — Addendum Note (Signed)
Addendum  created 11/22/16 1037 by Murvin Natal, MD   Anesthesia Intra Blocks edited, Sign clinical note

## 2016-11-23 NOTE — Discharge Summary (Signed)
Physician Discharge Summary  Patient ID: [name] MRN: [id number] DOB/AGE: [birth year] 66 y.o.  Admit date: 11/20/2016 Discharge date: 11/21/2016   Procedures:  Procedure(s) (LRB): RIGHT TOTAL KNEE ARTHROPLASTY (Right)  Attending Physician:  Dr. Paralee Cancel   Admission Diagnoses:   Right knee primary OA / pain  Discharge Diagnoses:  Principal Problem:   S/P right TKA Active Problems:   S/P total knee replacement  Past Medical History:  Diagnosis Date  . Anxiety   . Arthritis   . Cancer ([place])    basal cell on face  . GERD (gastroesophageal reflux disease)   . Hypertension   . PONV (postoperative nausea and vomiting)   . Sleep apnea    cpap- 3.5     HPI:    Mary Garner, 66 y.o. female, has a history of pain and functional disability in the right knee due to arthritis and has failed non-surgical conservative treatments for greater than 12 weeks to include NSAID's and/or analgesics, corticosteriod injections and activity modification.  Onset of symptoms was gradual, starting ~1 years ago with gradually worsening course since that time. The patient noted prior procedures on the knee to include  arthroscopy on the right knee(s).  Patient currently rates pain in the right knee(s) at 9 out of 10 with activity. Patient has worsening of pain with activity and weight bearing, pain that interferes with activities of daily living, pain with passive range of motion, crepitus and joint swelling.  Patient has evidence of periarticular osteophytes and joint space narrowing by imaging studies.  There is no active infection.   Risks, benefits and expectations were discussed with the patient.  Risks including but not limited to the risk of anesthesia, blood clots, nerve damage, blood vessel damage, failure of the prosthesis, infection and up to and including death.  Patient understand the risks, benefits and expectations and wishes to proceed with surgery.  PCP: Mary Polio, NP    Discharged Condition: good  Hospital Course:  Patient underwent the above stated procedure on 11/20/2016. Patient tolerated the procedure well and brought to the recovery room in good condition and subsequently to the floor.  POD #1 BP: 117/53 ; Pulse: 71 ; Temp: 97.4 F (36.3 C) ; Resp: 71 Patient reports pain as mild at this point after getting relief during night of some more intense pain. Ready to go home, knows what to do as she has been through this before with his left. Neurovascular intact and incision: dressing C/D/I.  LABS  Basename    HGB     9.9  HCT     29.2     Discharge Exam: General appearance: alert, cooperative and no distress Extremities: Homans sign is negative, no sign of DVT, no edema, redness or tenderness in the calves or thighs and no ulcers, gangrene or trophic changes  Disposition: Home with follow up in 2 weeks   Follow-up Information    Paralee Cancel, MD. Schedule an appointment as soon as possible for a visit in 2 week(s).   Specialty:  Orthopedic Surgery Contact information: [address] [place] [address] [phone]           Discharge Instructions    Call MD / Call 911    Complete by:  As directed    If you experience chest pain or shortness of breath, CALL 911 and be transported to the hospital emergency room.  If you develope a fever above 101 F, pus (white drainage) or increased  drainage or redness at the wound, or calf pain, call your surgeon's office.   Change dressing    Complete by:  As directed    Maintain surgical dressing until follow up in the clinic. If the edges start to pull up, may reinforce with tape. If the dressing is no longer working, may remove and cover with gauze and tape, but must keep the area dry and clean.  Call with any questions or concerns.   Constipation Prevention    Complete by:  As directed    Drink plenty of fluids.  Prune juice may be helpful.  You may use a stool softener,  such as Colace (over the counter) 100 mg twice a day.  Use MiraLax (over the counter) for constipation as needed.   Diet - low sodium heart healthy    Complete by:  As directed    Discharge instructions    Complete by:  As directed    Maintain surgical dressing until follow up in the clinic. If the edges start to pull up, may reinforce with tape. If the dressing is no longer working, may remove and cover with gauze and tape, but must keep the area dry and clean.  Follow up in 2 weeks at [facility]. Call with any questions or concerns.   Increase activity slowly as tolerated    Complete by:  As directed    Weight bearing as tolerated with assist device (walker, cane, etc) as directed, use it as long as suggested by your surgeon or therapist, typically at least 4-6 weeks.   TED hose    Complete by:  As directed    Use stockings (TED hose) for 2 weeks on both leg(s).  You may remove them at night for sleeping.      Allergies as of 11/21/2016      Reactions   Hctz [hydrochlorothiazide]    Dizziness       Medication List    STOP taking these medications   aspirin EC 81 MG tablet Replaced by:  aspirin 81 MG chewable tablet   TYLENOL 8 HOUR ARTHRITIS PAIN 650 MG CR tablet Generic drug:  acetaminophen     TAKE these medications   ALPRAZolam 0.5 MG tablet Commonly known as:  XANAX Take 0.5 mg by mouth at bedtime.   aspirin 81 MG chewable tablet Chew 1 tablet (81 mg total) by mouth 2 (two) times daily. Take for 4 weeks. Replaces:  aspirin EC 81 MG tablet   chlorthalidone 25 MG tablet Commonly known as:  HYGROTON Take 25 mg by mouth daily.   docusate sodium 100 MG capsule Commonly known as:  COLACE Take 1 capsule (100 mg total) by mouth 2 (two) times daily.   famotidine 20 MG tablet Commonly known as:  PEPCID Take 20 mg by mouth daily as needed for heartburn or indigestion.   ferrous sulfate 325 (65 FE) MG tablet Commonly known as:  FERROUSUL Take 1 tablet (325  mg total) by mouth 3 (three) times daily with meals.   FISH OIL PO Take 1,200 mg by mouth daily.   HYDROcodone-acetaminophen 7.5-325 MG tablet Commonly known as:  NORCO Take 1-2 tablets by mouth every 4 (four) hours as needed for moderate pain or severe pain.   lisinopril 30 MG tablet Commonly known as:  PRINIVIL,ZESTRIL Take 30 mg by mouth every morning.   methocarbamol 500 MG tablet Commonly known as:  ROBAXIN Take 1 tablet (500 mg total) by mouth every 6 (six) hours as needed  for muscle spasms.   polyethylene glycol packet Commonly known as:  MIRALAX / GLYCOLAX Take 17 g by mouth 2 (two) times daily.   potassium chloride 10 MEQ tablet Commonly known as:  K-DUR,KLOR-CON Take 10 mEq by mouth daily.   VITAMIN B-12 PO Take 500 mcg by mouth daily.   VITAMIN C PO Take 1,000 mg by mouth daily.   VITAMIN D PO Take 1,000 Units by mouth daily.        Signed: [place] Pugh. Jordie Schreur   PA-C  11/23/2016, 10:14 AM

## 2017-05-01 ENCOUNTER — Other Ambulatory Visit (HOSPITAL_COMMUNITY): Payer: Self-pay | Admitting: Otolaryngology

## 2017-05-01 DIAGNOSIS — H905 Unspecified sensorineural hearing loss: Secondary | ICD-10-CM

## 2017-05-08 ENCOUNTER — Other Ambulatory Visit (HOSPITAL_COMMUNITY): Payer: Self-pay | Admitting: Otolaryngology

## 2017-05-08 DIAGNOSIS — H905 Unspecified sensorineural hearing loss: Secondary | ICD-10-CM

## 2017-05-16 ENCOUNTER — Other Ambulatory Visit: Payer: 59

## 2017-05-21 ENCOUNTER — Ambulatory Visit (HOSPITAL_COMMUNITY)
Admission: RE | Admit: 2017-05-21 | Discharge: 2017-05-21 | Disposition: A | Discharge status: Home / Self Care | Payer: BLUE CROSS/BLUE SHIELD | Source: Ambulatory Visit | Attending: Otolaryngology | Admitting: Otolaryngology

## 2017-05-21 DIAGNOSIS — H903 Sensorineural hearing loss, bilateral: Secondary | ICD-10-CM | POA: Insufficient documentation

## 2017-05-21 DIAGNOSIS — H905 Unspecified sensorineural hearing loss: Secondary | ICD-10-CM

## 2018-03-08 IMAGING — MR MR HEAD W/O CM
9 of 11 series · 33 of 48 positions shown · non-contrast
Comparison: 01/07/2008

CLINICAL DATA: Left-sided hearing loss and vertigo

EXAM:
MRI HEAD WITHOUT CONTRAST
TECHNIQUE: Multiplanar, multiecho pulse sequences of the brain and surrounding
structures were obtained without intravenous contrast.

[Series 3: DWI · axial · 3.0mm · 0.94mm/px · z∈[-116,+28]mm · 8 of 98 slices shown (1 of 2)]
[im 1/98]
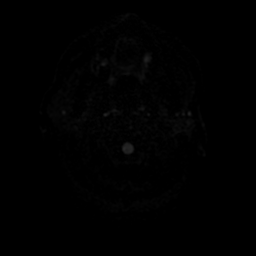
[im 14/98]
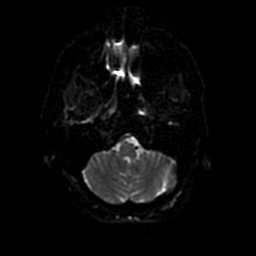
[im 28/98]
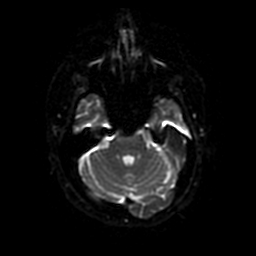
[im 42/98]
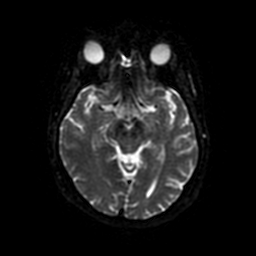
[im 56/98]
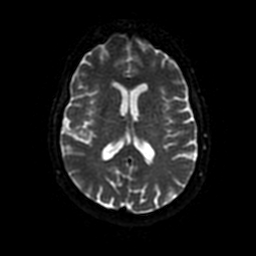
[im 70/98]
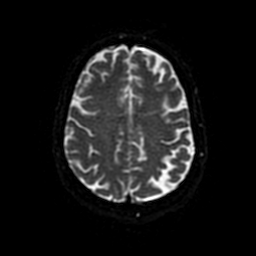
[im 84/98]
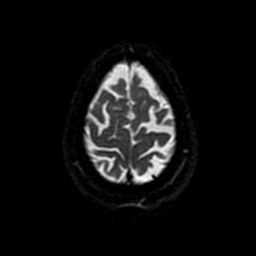
[im 98/98]
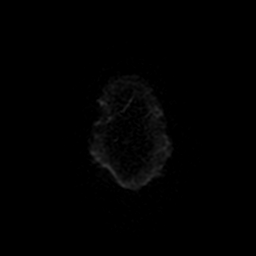

[Series 4: DWI · coronal · 4.0mm · 0.94mm/px · 5 of 66 slices shown (2 of 2)]
[im 1/66]
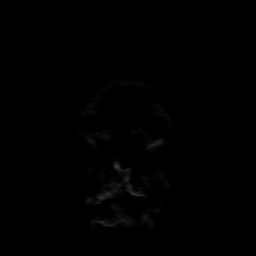
[im 17/66]
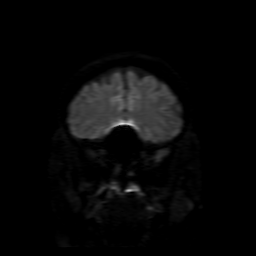
[im 33/66]
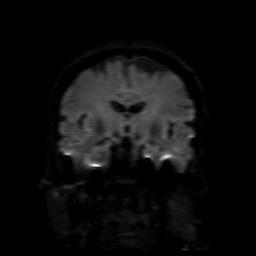
[im 49/66]
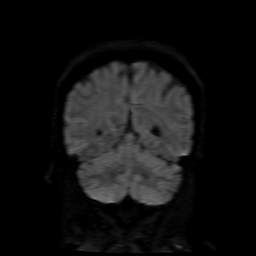
[im 66/66]
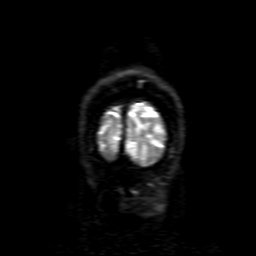

[Series 5: FLAIR · sagittal · 5.0mm · 0.47mm/px · 2 of 23 slices shown (1 of 2)]
[im 1/23]
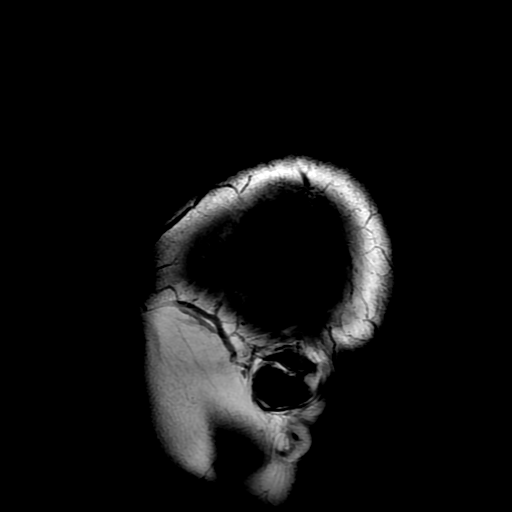
[im 23/23]
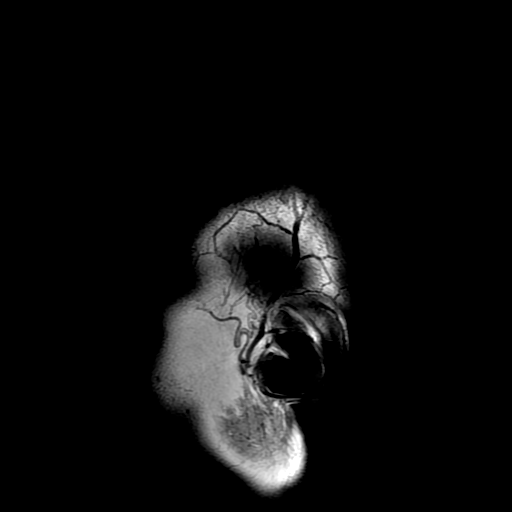

[Series 6: T2 · axial · 5.0mm · 0.47mm/px · z∈[-106,+38]mm · 2 of 25 slices shown (1 of 2)]
[im 1/25]
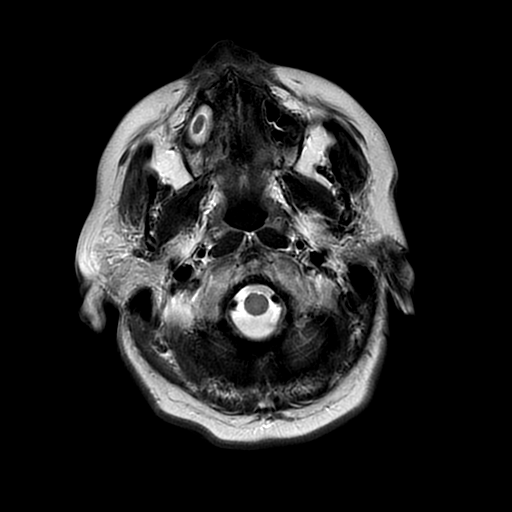
[im 25/25]
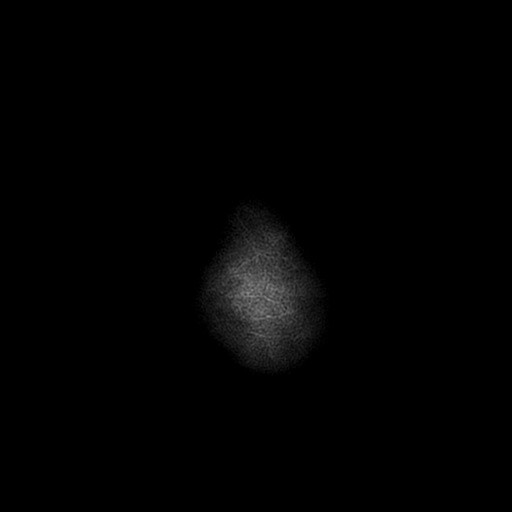

[Series 7: FLAIR · axial · 5.0mm · 0.47mm/px · z∈[-106,+38]mm · 2 of 25 slices shown (2 of 2)]
[im 1/25]
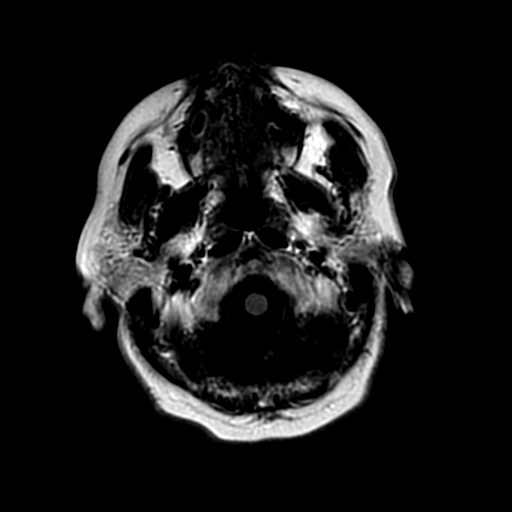
[im 25/25]
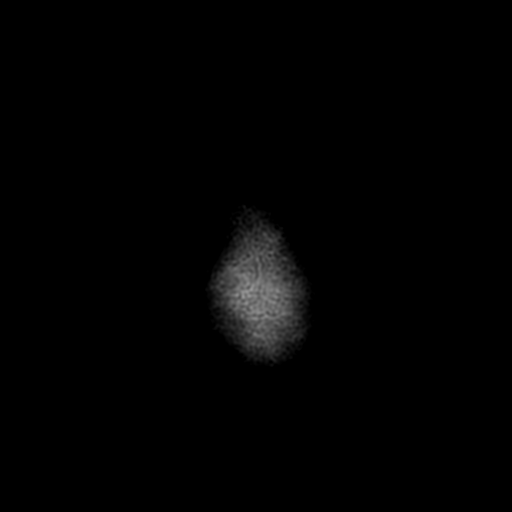

[Series 8: (person_name) · axial · 3.0mm · 0.47mm/px · z∈[-102,-19]mm · 5 of 100 slices shown]
[im 1/100]
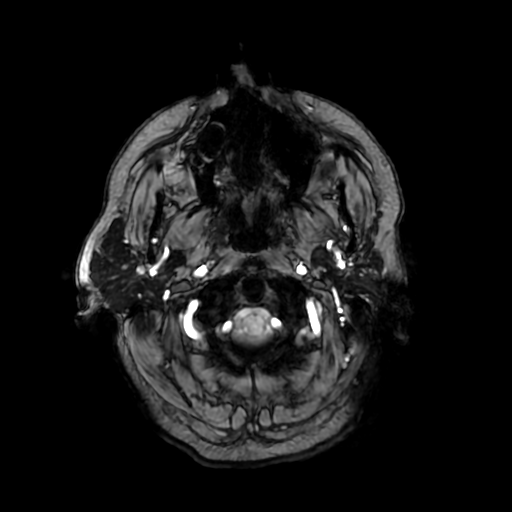
[im 15/100]
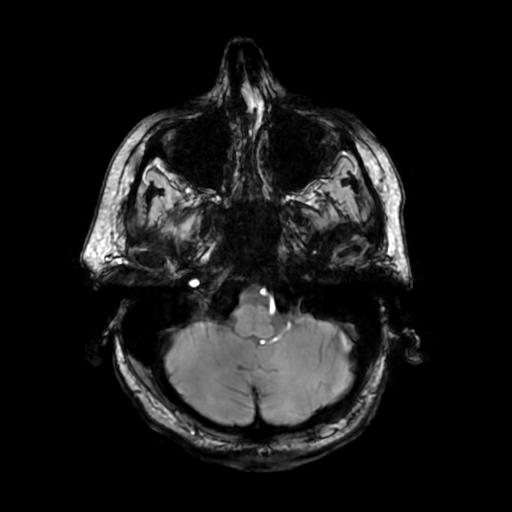
[im 29/100]
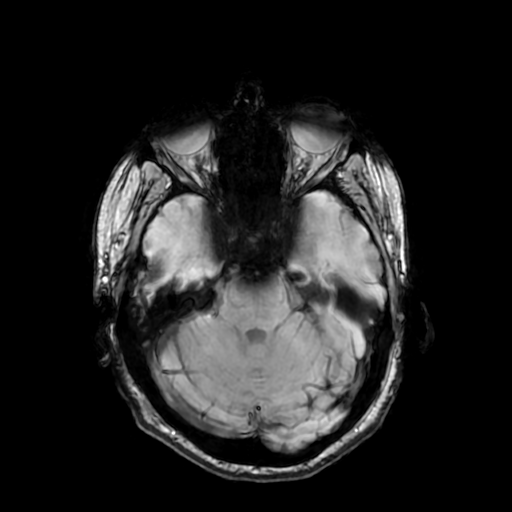
[im 43/100]
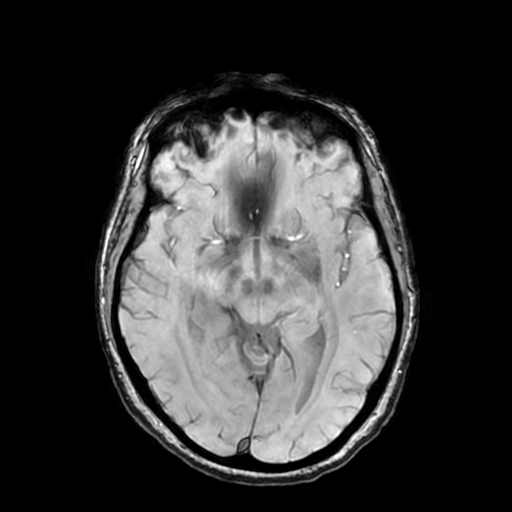
[im 57/100]
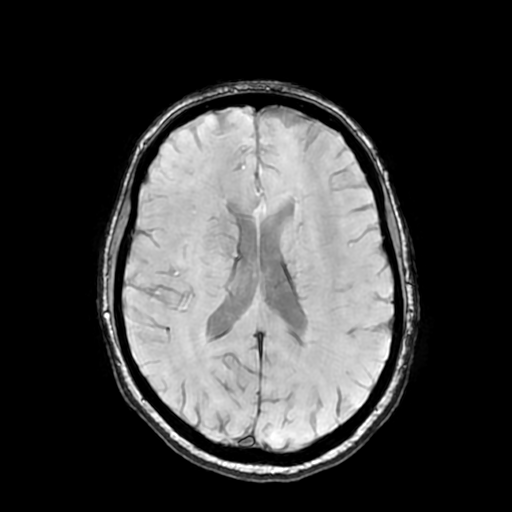

[Series 10: T2 · coronal · 5.0mm · 0.47mm/px · 2 of 29 slices shown (2 of 2)]
[im 1/29]
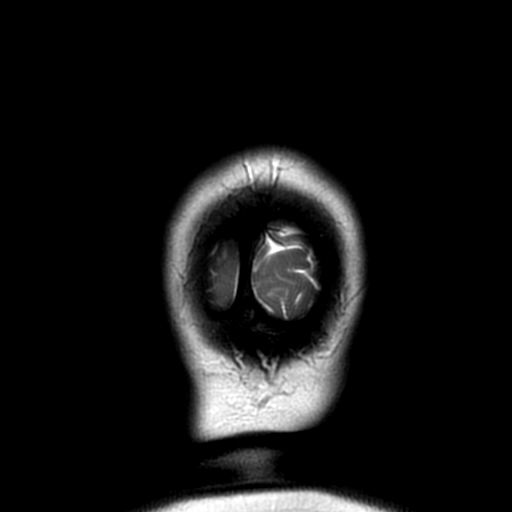
[im 29/29]
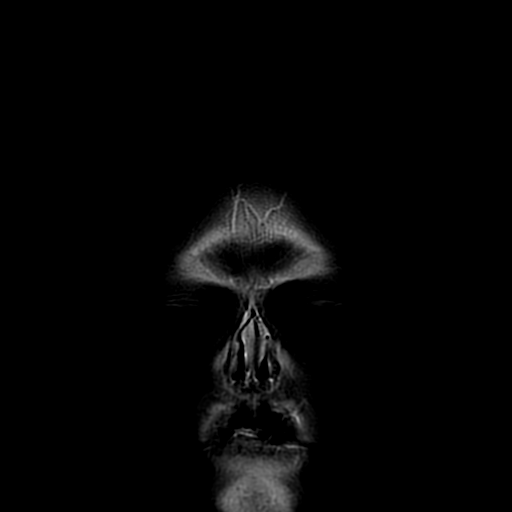

[Series 350: ADC · axial · 3.0mm · 0.94mm/px · z∈[-116,+28]mm · 4 of 49 slices shown (1 of 2)]
[im 1/49]
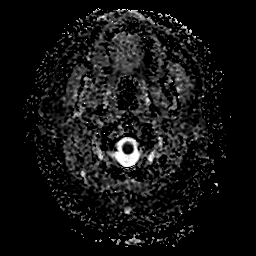
[im 17/49]
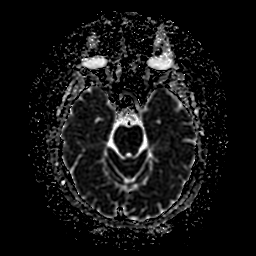
[im 33/49]
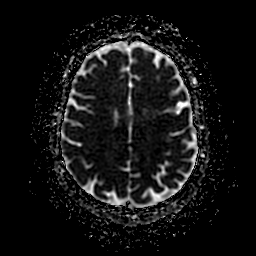
[im 49/49]
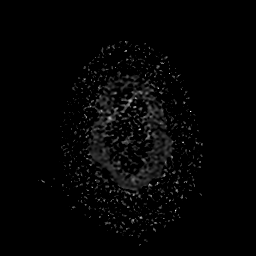

[Series 450: ADC · coronal · 4.0mm · 0.94mm/px · 3 of 33 slices shown (2 of 2)]
[im 1/33]
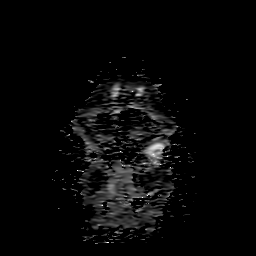
[im 17/33]
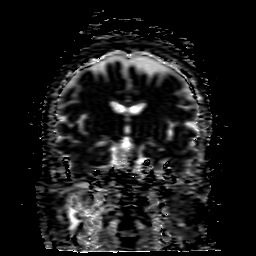
[im 33/33]
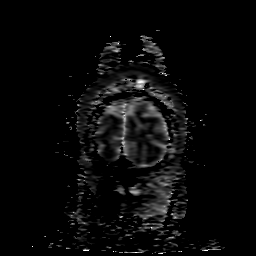

[33 of 48 positions shown; findings below may reference images not displayed]

FINDINGS: Brain: The brain has normal appearance without evidence of
malformation, atrophy, old or acute small or large vessel
infarction, hemorrhage, hydrocephalus or extra-axial collection. No
pituitary abnormality. This study was not ordered with contrast. No
sign of vestibular schwannoma.

Vascular: Major vessels at the base of the brain show flow.

Skull and upper cervical spine: Normal

Sinuses/Orbits: Clear/ normal.  No fluid in the mastoids.

Other: None significant.
IMPRESSION: Normal MRI of the brain, done using standard protocol. No cause of
sensorineural hearing loss identified.
